# Patient Record
Sex: Female | Born: 1965 | Race: Black or African American | Hispanic: No | Marital: Single | Smoking: Former smoker
Health system: Southern US, Community
[De-identification: ages and names within clinical notes are randomized; demographics above are authoritative.]

## PROBLEM LIST (undated history)

## (undated) DIAGNOSIS — J302 Other seasonal allergic rhinitis: Secondary | ICD-10-CM

## (undated) DIAGNOSIS — K219 Gastro-esophageal reflux disease without esophagitis: Secondary | ICD-10-CM

## (undated) DIAGNOSIS — I10 Essential (primary) hypertension: Secondary | ICD-10-CM

## (undated) HISTORY — DX: Gastro-esophageal reflux disease without esophagitis: K21.9

## (undated) HISTORY — DX: Essential (primary) hypertension: I10

## (undated) HISTORY — PX: FOOT SURGERY: SHX648

## (undated) HISTORY — DX: Other seasonal allergic rhinitis: J30.2

## (undated) HISTORY — PX: BUNIONECTOMY: SHX129

---

## 2005-08-20 ENCOUNTER — Emergency Department: Payer: Self-pay | Admitting: Emergency Medicine

## 2005-10-02 ENCOUNTER — Emergency Department: Payer: Self-pay | Admitting: Emergency Medicine

## 2005-11-09 ENCOUNTER — Emergency Department: Payer: Self-pay | Admitting: Emergency Medicine

## 2005-11-10 ENCOUNTER — Ambulatory Visit: Payer: Self-pay | Admitting: Emergency Medicine

## 2006-04-06 ENCOUNTER — Emergency Department: Payer: Self-pay | Admitting: Internal Medicine

## 2006-05-17 ENCOUNTER — Observation Stay: Payer: Self-pay

## 2006-05-30 ENCOUNTER — Inpatient Hospital Stay: Payer: Self-pay

## 2006-10-28 ENCOUNTER — Emergency Department: Payer: Self-pay | Admitting: Emergency Medicine

## 2007-11-11 ENCOUNTER — Emergency Department: Payer: Self-pay | Admitting: Emergency Medicine

## 2007-12-25 ENCOUNTER — Ambulatory Visit: Payer: Self-pay | Admitting: Obstetrics and Gynecology

## 2008-01-02 ENCOUNTER — Ambulatory Visit: Payer: Self-pay | Admitting: Obstetrics and Gynecology

## 2011-11-25 ENCOUNTER — Emergency Department: Payer: Self-pay | Admitting: Emergency Medicine

## 2012-05-17 ENCOUNTER — Emergency Department: Payer: Self-pay | Admitting: Emergency Medicine

## 2015-05-15 ENCOUNTER — Emergency Department
Admission: EM | Admit: 2015-05-15 | Discharge: 2015-05-15 | Disposition: A | Discharge status: Home / Self Care | Payer: BLUE CROSS/BLUE SHIELD | Attending: Emergency Medicine | Admitting: Emergency Medicine

## 2015-05-15 ENCOUNTER — Encounter: Payer: Self-pay | Admitting: *Deleted

## 2015-05-15 DIAGNOSIS — X58XXXA Exposure to other specified factors, initial encounter: Secondary | ICD-10-CM | POA: Diagnosis not present

## 2015-05-15 DIAGNOSIS — T1591XA Foreign body on external eye, part unspecified, right eye, initial encounter: Secondary | ICD-10-CM | POA: Diagnosis present

## 2015-05-15 DIAGNOSIS — Y9289 Other specified places as the place of occurrence of the external cause: Secondary | ICD-10-CM | POA: Insufficient documentation

## 2015-05-15 DIAGNOSIS — S0501XA Injury of conjunctiva and corneal abrasion without foreign body, right eye, initial encounter: Secondary | ICD-10-CM | POA: Insufficient documentation

## 2015-05-15 DIAGNOSIS — Y990 Civilian activity done for income or pay: Secondary | ICD-10-CM | POA: Insufficient documentation

## 2015-05-15 DIAGNOSIS — Y9389 Activity, other specified: Secondary | ICD-10-CM | POA: Diagnosis not present

## 2015-05-15 MED ORDER — CIPROFLOXACIN HCL 0.3 % OP SOLN: 1.0000 [drp] | OPHTHALMIC | Status: AC

## 2015-05-15 MED ORDER — KETOROLAC TROMETHAMINE 0.5 % OP SOLN: 1.0000 [drp] | Freq: Four times a day (QID) | OPHTHALMIC | Status: DC

## 2015-05-15 MED ORDER — FLUORESCEIN SODIUM 1 MG OP STRP
ORAL_STRIP | OPHTHALMIC | Status: AC
  Filled 2015-05-15: qty 1

## 2015-05-15 MED ORDER — CIPROFLOXACIN HCL 0.3 % OP SOLN
2.0000 [drp] | OPHTHALMIC | Status: DC
  Administered 2015-05-15: 2 [drp] via OPHTHALMIC
  Filled 2015-05-15: qty 2.5

## 2015-05-15 NOTE — ED Notes (Signed)
Pt reports feeling like there is something in right eye

## 2015-05-15 NOTE — Discharge Instructions (Signed)
Corneal Abrasion °The cornea is the clear covering at the front and center of the eye. When looking at the colored portion of the eye (iris), you are looking through the cornea. This very thin tissue is made up of many layers. The surface layer is a single layer of cells (corneal epithelium) and is one of the most sensitive tissues in the body. If a scratch or injury causes the corneal epithelium to come off, it is called a corneal abrasion. If the injury extends to the tissues below the epithelium, the condition is called a corneal ulcer. °CAUSES  °· Scratches. °· Trauma. °· Foreign body in the eye. °Some people have recurrences of abrasions in the area of the original injury even after it has healed (recurrent erosion syndrome). Recurrent erosion syndrome generally improves and goes away with time. °SYMPTOMS  °· Eye pain. °· Difficulty or inability to keep the injured eye open. °· The eye becomes very sensitive to light. °· Recurrent erosions tend to happen suddenly, first thing in the morning, usually after waking up and opening the eye. °DIAGNOSIS  °Your health care provider can diagnose a corneal abrasion during an eye exam. Dye is usually placed in the eye using a drop or a small paper strip moistened by your tears. When the eye is examined with a special light, the abrasion shows up clearly because of the dye. °TREATMENT  °· Small abrasions may be treated with antibiotic drops or ointment alone. °· A pressure patch may be put over the eye. If this is done, follow your doctor's instructions for when to remove the patch. Do not drive or use machines while the eye patch is on. Judging distances is hard to do with a patch on. °If the abrasion becomes infected and spreads to the deeper tissues of the cornea, a corneal ulcer can result. This is serious because it can cause corneal scarring. Corneal scars interfere with light passing through the cornea and cause a loss of vision in the involved eye. °HOME CARE  INSTRUCTIONS °· Use medicine or ointment as directed. Only take over-the-counter or prescription medicines for pain, discomfort, or fever as directed by your health care provider. °· Do not drive or operate machinery if your eye is patched. Your ability to judge distances is impaired. °· If your health care provider has given you a follow-up appointment, it is very important to keep that appointment. Not keeping the appointment could result in a severe eye infection or permanent loss of vision. If there is any problem keeping the appointment, let your health care provider know. °SEEK MEDICAL CARE IF:  °· You have pain, light sensitivity, and a scratchy feeling in one eye or both eyes. °· Your pressure patch keeps loosening up, and you can blink your eye under the patch after treatment. °· Any kind of discharge develops from the eye after treatment or if the lids stick together in the morning. °· You have the same symptoms in the morning as you did with the original abrasion days, weeks, or months after the abrasion healed. °MAKE SURE YOU:  °· Understand these instructions. °· Will watch your condition. °· Will get help right away if you are not doing well or get worse. °Document Released: 08/31/2000 Document Revised: 09/08/2013 Document Reviewed: 05/11/2013 °ExitCare® Patient Information ©2015 [employer]. This information is not intended to replace advice given to you by your health care provider. Make sure you discuss any questions you have with your health care provider. ° °

## 2015-05-15 NOTE — ED Provider Notes (Signed)
[facility] Emergency Department Provider Note ____________________________________________  Time seen: Approximately 2:38 PM  I have reviewed the triage vital signs and the nursing notes.   HISTORY  Chief Complaint Foreign Body in Eye   HPI Sandra Brandt is a 49 y.o. female who presents to the emergency department for evaluation of right eye pain. She states that she felt something fall into her eye yesterday afternoon when she was outside putting chairs away at work. She states she thought she got whatever was out but has continued to have worsening right eye irritation. She states that when she can keep her eye open her vision is unchanged.    History reviewed. No pertinent past medical history.  There are no active problems to display for this patient.   History reviewed. No pertinent past surgical history.  Current Outpatient Rx  Name  Route  Sig  Dispense  Refill  . ciprofloxacin (CILOXAN) 0.3 % ophthalmic solution   Right Eye   Place 1 drop into the right eye every 4 (four) hours while awake. Administer 1 drop, every 2 hours, while awake, for 2 days. Then 1 drop, every 4 hours, while awake, for the next 5 days.   5 mL   0   . ketorolac (ACULAR) 0.5 % ophthalmic solution   Right Eye   Place 1 drop into the right eye 4 (four) times daily.   5 mL   0     Allergies Review of patient's allergies indicates no known allergies.  No family history on file.  Social History Social History  Substance Use Topics  . Smoking status: Never Smoker   . Smokeless tobacco: None  . Alcohol Use: No    Review of Systems   Constitutional: No fever/chills Eyes: Visual changes: no. ENT: No sore throat. Cardiovascular: Denies chest pain. Respiratory: Denies shortness of breath. Gastrointestinal: No abdominal pain.  No nausea, no vomiting.  No diarrhea.  No constipation. Musculoskeletal: Negative for pain. Skin: Negative for rash. Neurological:  Negative for headaches, focal weakness or numbness. Psychiatric:At baseline, no complaint Lymphatic:Swollen nodes-- no Allergic: Seasonal allergies: no 10-point ROS otherwise negative.  ____________________________________________  PHYSICAL EXAM:  VITAL SIGNS: ED Triage Vitals  Enc Vitals Group     BP 05/15/15 1356 156/100 mmHg     Pulse Rate 05/15/15 1356 57     Resp 05/15/15 1356 20     Temp 05/15/15 1356 98.9 F (37.2 C)     Temp Source 05/15/15 1356 Oral     SpO2 05/15/15 1356 100 %     Weight 05/15/15 1356 210 lb (95.255 kg)     Height 05/15/15 1356 5\' 7"  (1.702 m)     Head Cir --      Peak Flow --      Pain Score --      Pain Loc --      Pain Edu? --      Excl. in GC? --     Constitutional: Alert and oriented. Well appearing and in no acute distress. Eyes: Visual acuity--see nursing documentation; No globe trauma; Eyelids normal to inspection; Everted for exam yes; Conjunctiva and sclera: Mildly erythematous, no foreign body noted on magnified exam; Corneas: Linear abrasion to the 12:00 position as well as a second abrasion centrally; Examined with fluorescein yes; EOM's intact; Pupils PERRLA; Anterior Chambers normal with limited exam.  Head: Atraumatic. Nose: No congestion/rhinnorhea. Mouth/Throat: Mucous membranes are moist.  Oropharynx non-erythematous. Neck: No stridor.  Cardiovascular: Normal rate,  regular rhythm. Grossly normal heart sounds.  Good peripheral circulation. Respiratory: Normal respiratory effort.  No retractions. Gastrointestinal: Soft and nontender. No distention. No abdominal bruits. No CVA tenderness. Musculoskeletal:Normal ROM Neurologic:  Normal speech and language. No gross focal neurologic deficits are appreciated. Speech is normal. No gait instability. Skin:  Skin is warm, dry and intact. No rash noted. Psychiatric: Mood and affect are normal. Speech and behavior are normal.  ____________________________________________   LABS (all  labs ordered are listed, but only abnormal results are displayed)  Labs Reviewed - No data to display ____________________________________________  EKG   ____________________________________________  RADIOLOGY   ____________________________________________   PROCEDURES  Procedure(s) performed: None  ____________________________________________   INITIAL IMPRESSION / ASSESSMENT AND PLAN / ED COURSE  Pertinent labs & imaging results that were available during my care of the patient were reviewed by me and considered in my medical decision making (see chart for details).   Patient was advised to follow up with ophthalmology for symptoms that are not improving over the next 2 days. She was  also advised to return to the ER for symptoms that change or worsen if unable to schedule an appointment.  ____________________________________________   FINAL CLINICAL IMPRESSION(S) / ED DIAGNOSES  Final diagnoses:  Corneal abrasion, right, initial encounter       Chinita Pester, FNP 05/15/15 1442  Jennye Moccasin, MD 05/15/15 1544

## 2015-06-10 ENCOUNTER — Encounter: Payer: Self-pay | Admitting: Emergency Medicine

## 2015-06-10 ENCOUNTER — Emergency Department
Admission: EM | Admit: 2015-06-10 | Discharge: 2015-06-10 | Disposition: A | Discharge status: Home / Self Care | Payer: BLUE CROSS/BLUE SHIELD | Attending: Emergency Medicine | Admitting: Emergency Medicine

## 2015-06-10 DIAGNOSIS — M779 Enthesopathy, unspecified: Secondary | ICD-10-CM

## 2015-06-10 DIAGNOSIS — J302 Other seasonal allergic rhinitis: Secondary | ICD-10-CM | POA: Diagnosis not present

## 2015-06-10 DIAGNOSIS — M778 Other enthesopathies, not elsewhere classified: Secondary | ICD-10-CM

## 2015-06-10 DIAGNOSIS — Z791 Long term (current) use of non-steroidal anti-inflammatories (NSAID): Secondary | ICD-10-CM | POA: Diagnosis not present

## 2015-06-10 DIAGNOSIS — M65241 Calcific tendinitis, right hand: Secondary | ICD-10-CM | POA: Insufficient documentation

## 2015-06-10 DIAGNOSIS — M79641 Pain in right hand: Secondary | ICD-10-CM | POA: Diagnosis present

## 2015-06-10 MED ORDER — MELOXICAM 15 MG PO TABS: 15.0000 mg | ORAL_TABLET | Freq: Every day | ORAL | Status: DC

## 2015-06-10 NOTE — ED Notes (Signed)
States she developed pain to right hand couple of days ago. With min swelling to right knuckle area ..good pulses and circulation

## 2015-06-10 NOTE — ED Notes (Signed)
Reports right hand pain.  States she sweeps a lot at work

## 2015-06-10 NOTE — ED Provider Notes (Signed)
[facility] Emergency Department [place] Note ____________________________________________  Time seen: Approximately 10:18 AM  I have reviewed the triage vital signs and the nursing notes.   HISTORY  Chief Complaint Hand Pain    HPI Sandra Brandt is a 49 y.o. female presenting with right hand pain for the last 3-4 days. 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 She started noticing her right hand where it holds the broom has become acutely painful, stiff, and swollen. Pain mostly located to right index finger, thumb, and radiates to wrist. Pain is 10/10 stiff, throbbing pain with associated numbness and tingling. Pain is constant, but worsens with movement. Patient has also noticed swelling and finds it more difficult to extend her right index finger and have a strong grip. She has had no injury besides repetitive motion to that hand. She has tried no medications to relieve the pain and states that the pain and swelling is worsening. Patient denies erythema or bruising to the hand. Patient denies fever, chills, nausea, vomiting, diarrhea, and constipation.   History reviewed. No pertinent past medical history.  There are no active problems to display for this patient.   History reviewed. No pertinent past surgical history.  Current Outpatient Rx  Name  Route  Sig  Dispense  Refill  . ketorolac (ACULAR) 0.5 % ophthalmic solution   Right Eye   Place 1 drop into the right eye 4 (four) times daily.   5 mL   0   . meloxicam (MOBIC) 15 MG tablet   Oral   Take 1 tablet (15 mg total) by mouth daily.   30 tablet   2     Allergies Review of patient's allergies indicates no known allergies.  History reviewed. No pertinent family history.  Social History Social History  Substance Use Topics  . Smoking status: Never Smoker   . Smokeless tobacco: None  . Alcohol Use: No    Review of Systems Constitutional: No fever/chills Eyes: No visual  changes. ENT: No sore throat. Cardiovascular: Denies chest pain. Respiratory: Denies shortness of breath. Gastrointestinal: No abdominal pain.  No nausea, no vomiting.  No diarrhea.  No constipation. Genitourinary: Negative for dysuria. Musculoskeletal: Negative for back pain. Positive for right hand pain.  Skin: Negative for rash. Positive for swelling in right hand.  Neurological: Negative for headaches. Positive for focal weakness and numbness in right hand. Hematological/Lymphatic:Negative for easy bruising/bleeding Allergic/Immunilogical: Positive for seasonal allergies 10-point ROS otherwise negative.  ____________________________________________   PHYSICAL EXAM:  VITAL SIGNS: ED Triage Vitals  Enc Vitals Group     BP 06/10/15 0906 113/74 mmHg     Pulse Rate 06/10/15 0906 79     Resp 06/10/15 0906 20     Temp 06/10/15 0906 98.2 F (36.8 C)     Temp Source 06/10/15 0906 Oral     SpO2 06/10/15 0906 100 %     Weight 06/10/15 0906 210 lb (95.255 kg)     Height 06/10/15 0906  (1.702 m)     Head Cir --      Peak Flow --      Pain Score 06/10/15 0907 9     Pain Loc --      Pain Edu? --      Excl. in GC? --     Constitutional: Alert and oriented. Well appearing and in no acute distress. Eyes: Conjunctivae are normal. PERRL. EOMI. Head: Atraumatic. Nose: No congestion/rhinnorhea. Mouth/Throat: Mucous membranes are moist.  Oropharynx non-erythematous. Neck: No stridor.   Hematological/Lymphatic/Immunilogical: No cervical lymphadenopathy. Cardiovascular: Normal rate, regular rhythm. Grossly normal heart sounds.  Good peripheral circulation. Respiratory: Normal respiratory effort.  No retractions. Lungs CTAB. Gastrointestinal: Soft and nontender. No distention. No abdominal bruits. No CVA tenderness. Musculoskeletal: Right hand is swollen from index finger to thumb and down to wrist. No erythema or bruising noted. AROM of right index finger reduced and pain noted on  PROM of right index finger. Thumb abduction and adduction intact but painful. Strength 4/5 on right wrist. 5/5 strength on left upper extremity. Decreased grip strength on the right. No lower extremity tenderness nor edema.   Neurologic:  Normal speech and language. No gross focal neurologic deficits are appreciated. Sensation intact bilaterally. No gait instability. Skin:  Skin is warm, dry and intact. No rash noted. Swelling noted to right hand from index finger to thumb down to wrist.  Psychiatric: Mood and affect are normal. Speech and behavior are normal.  ____________________________________________   LABS (all labs ordered are listed, but only abnormal results are displayed)  Labs Reviewed - No data to display ____________________________________________  EKG  None ____________________________________________  RADIOLOGY  None indicated ____________________________________________   PROCEDURES  Procedure(s) performed:  SPLINT APPLICATION Date/Time: 1:45 PM Authorized by: Kem Boroughs Consent: Verbal consent obtained. Risks and benefits: risks, benefits and alternatives were discussed Consent given by: patient Splint applied by: RN  Location details: right wrist Splint type: cock up Supplies used: Velcro splint Post-procedure: The splinted body part was neurovascularly unchanged following the procedure. Patient tolerance: Patient tolerated the procedure well with no immediate complications.  Also, the index and 3rd digit were buddy taped.    Critical Care performed: No  ____________________________________________   INITIAL IMPRESSION / ASSESSMENT AND PLAN / ED COURSE  Pertinent labs & imaging results that were available during my care of the patient were reviewed by me and considered in my medical decision making (see chart for details).  Patient was advised follow-up with orthopedics if symptoms are not improving over the next week. She was advised to  take the medications as prescribed. She was advised to return to emergency department for symptoms that change or worsen if she is unable schedule an appointment with orthopedics ____________________________________________   FINAL CLINICAL IMPRESSION(S) / ED DIAGNOSES  Final diagnoses:  Tendinitis of right hand     Chinita Pester, FNP 06/10/15 1345  Darien Ramus, MD 06/10/15 1544

## 2015-06-10 NOTE — Discharge Instructions (Signed)

## 2015-07-14 ENCOUNTER — Emergency Department
Admission: EM | Admit: 2015-07-14 | Discharge: 2015-07-14 | Disposition: A | Discharge status: Home / Self Care | Payer: BLUE CROSS/BLUE SHIELD | Attending: Emergency Medicine | Admitting: Emergency Medicine

## 2015-07-14 ENCOUNTER — Encounter: Payer: Self-pay | Admitting: Emergency Medicine

## 2015-07-14 DIAGNOSIS — R030 Elevated blood-pressure reading, without diagnosis of hypertension: Secondary | ICD-10-CM | POA: Diagnosis not present

## 2015-07-14 DIAGNOSIS — Z791 Long term (current) use of non-steroidal anti-inflammatories (NSAID): Secondary | ICD-10-CM | POA: Insufficient documentation

## 2015-07-14 DIAGNOSIS — IMO0001 Reserved for inherently not codable concepts without codable children: Secondary | ICD-10-CM

## 2015-07-14 DIAGNOSIS — K0889 Other specified disorders of teeth and supporting structures: Secondary | ICD-10-CM | POA: Diagnosis present

## 2015-07-14 DIAGNOSIS — K029 Dental caries, unspecified: Secondary | ICD-10-CM | POA: Insufficient documentation

## 2015-07-14 MED ORDER — TRAMADOL HCL 50 MG PO TABS: 50.0000 mg | ORAL_TABLET | Freq: Four times a day (QID) | ORAL | Status: DC | PRN

## 2015-07-14 MED ORDER — AMOXICILLIN 500 MG PO CAPS: 500.0000 mg | ORAL_CAPSULE | Freq: Three times a day (TID) | ORAL | Status: DC

## 2015-07-14 NOTE — Discharge Instructions (Signed)
Dental Caries Dental caries is tooth decay. This decay can cause a hole in teeth (cavity) that can get bigger and deeper over time. HOME CARE  Brush and floss your teeth. Do this at least two times a day.  Use a fluoride toothpaste.  Use a mouth rinse if told by your dentist or doctor.  Eat less sugary and starchy foods. Drink less sugary drinks.  Avoid snacking often on sugary and starchy foods. Avoid sipping often on sugary drinks.  Keep regular checkups and cleanings with your dentist.  Use fluoride supplements if told by your dentist or doctor.  Allow fluoride to be applied to teeth if told by your dentist or doctor.   This information is not intended to replace advice given to you by your health care provider. Make sure you discuss any questions you have with your health care provider.   Document Released: 06/12/2008 Document Revised: 09/24/2014 Document Reviewed: 09/05/2012 Elsevier Interactive Patient Education [employer]2016 [employer]    You have still need to follow-up with a dentist of your choice for her dental pain. Take all of amoxicillin until completely finished. Tramadol as needed for severe pain. You will also need to see a orthopedist for your continued wrist pain. Dr. Martha ClanKrasinski is the orthopedist on call today. You need to call and make an appointment.

## 2015-07-14 NOTE — ED Notes (Signed)
States tooth pain for the past few days  With some facial swelling

## 2015-07-14 NOTE — ED Provider Notes (Signed)
[facility] Emergency Department Provider Note  ____________________________________________  Time seen: Approximately 8:21 AM  I have reviewed the triage vital signs and the nursing notes.   HISTORY  Chief Complaint Dental Pain    HPI Sandra Brandt is a 49 y.o. female is here with complaint of toothache for the last 2-3 days with some facial swelling this morning. Patient states that she is also here for tendinitis of her wrist and has forgotten the name of the orthopedist that she was supposed to follow up with. She has not taken any over-the-counter medication for her tooth other then Tylenol last evening. Currently she is not menopausal with a dentist.Patient states this morning her dental pain is worse and also causing pain into her right ear. She denies any known history of fever and denies chills. Patient rates her pain as a 10 out of 10. Pain is worse with smoking area and she denies any history of hypertension. There is a family history of hypertension.   History reviewed. No pertinent past medical history.  There are no active problems to display for this patient.   History reviewed. No pertinent past surgical history.  Current Outpatient Rx  Name  Route  Sig  Dispense  Refill  . amoxicillin (AMOXIL) 500 MG capsule   Oral   Take 1 capsule (500 mg total) by mouth 3 (three) times daily.   30 capsule   0   . ketorolac (ACULAR) 0.5 % ophthalmic solution   Right Eye   Place 1 drop into the right eye 4 (four) times daily.   5 mL   0   . meloxicam (MOBIC) 15 MG tablet   Oral   Take 1 tablet (15 mg total) by mouth daily.   30 tablet   2   . traMADol (ULTRAM) 50 MG tablet   Oral   Take 1 tablet (50 mg total) by mouth every 6 (six) hours as needed.   12 tablet   0     Allergies Review of patient's allergies indicates no known allergies.  No family history on file.  Social History Social History  Substance Use Topics  . Smoking  status: Never Smoker   . Smokeless tobacco: None  . Alcohol Use: No    Review of Systems Constitutional: No fever/chills ENT: Positive right ear pain Cardiovascular: Denies chest pain. Respiratory: Denies shortness of breath. Gastrointestinal:   No nausea, no vomiting.   Musculoskeletal: Negative for back pain. Skin: Negative for rash. Neurological: Negative for headaches, focal weakness or numbness.  10-point ROS otherwise negative.  ____________________________________________   PHYSICAL EXAM:  VITAL SIGNS: ED Triage Vitals  Enc Vitals Group     BP --      Pulse --      Resp --      Temp --      Temp src --      SpO2 --      Weight --      Height --      Head Cir --      Peak Flow --      Pain Score --      Pain Loc --      Pain Edu? --      Excl. in GC? --     Constitutional: Alert and oriented. Well appearing and in no acute distress. Eyes: Conjunctivae are normal. PERRL. EOMI. Head: Atraumatic. Nose: No congestion/rhinnorhea.     Right EAC and TM is clear Mouth/Throat:  Mucous membranes are moist.  Oropharynx non-erythematous. Right lower molar has dental carry between the bridge of her tooth teeth. There is some minimal gum edema but no actual abscess seen. Area is moderately tender. Neck: No stridor.  Supple Hematological/Lymphatic/Immunilogical: No cervical lymphadenopathy. Cardiovascular: Normal rate, regular rhythm. Grossly normal heart sounds.  Good peripheral circulation. Respiratory: Normal respiratory effort.  No retractions. Lungs CTAB. Gastrointestinal: Soft and nontender. No distention. Musculoskeletal: No lower extremity tenderness nor edema.  No joint effusions. Neurologic:  Normal speech and language. No gross focal neurologic deficits are appreciated. No gait instability. Skin:  Skin is warm, dry and intact. No rash noted. Psychiatric: Mood and affect are normal. Speech and behavior are normal.  ____________________________________________    LABS (all labs ordered are listed, but only abnormal results are displayed)  Labs Reviewed - No data to display  PROCEDURES  Procedure(s) performed: None  Critical Care performed: No  ____________________________________________   INITIAL IMPRESSION / ASSESSMENT AND PLAN / ED COURSE  Pertinent labs & imaging results that were available during my care of the patient were reviewed by me and considered in my medical decision making (see chart for details).  Patient was started on amoxicillin 500 mg 3 times a day for 10 days along with tramadol every 4 hours when necessary severe pain #12 no refill. Patient was also given the name of the orthopedist that was on call today for follow-up of her continued right wrist pain. Blood pressure was rechecked and patient was told to follow-up with her PCP for reevaluation of her elevated blood pressure. Patient works the night shift and states she is in a great deal of pain. She has not been told in the past that she had hypertension. Repeat blood pressure in the emergency room was decreased in comparison to the triage vital signs. ____________________________________________   FINAL CLINICAL IMPRESSION(S) / ED DIAGNOSES  Final diagnoses:  Pain due to dental caries  Elevated blood pressure      Tommi RumpsRhonda L Sailor Hevia, PA-C 07/14/15 [id number]  Arnaldo NatalPaul F Malinda, MD 07/14/15 1459

## 2017-09-06 ENCOUNTER — Encounter: Payer: Self-pay | Admitting: Emergency Medicine

## 2017-09-06 ENCOUNTER — Emergency Department: Payer: BLUE CROSS/BLUE SHIELD

## 2017-09-06 ENCOUNTER — Emergency Department
Admission: EM | Admit: 2017-09-06 | Discharge: 2017-09-06 | Disposition: A | Discharge status: Home / Self Care | Payer: BLUE CROSS/BLUE SHIELD | Attending: Emergency Medicine | Admitting: Emergency Medicine

## 2017-09-06 DIAGNOSIS — Y929 Unspecified place or not applicable: Secondary | ICD-10-CM | POA: Diagnosis not present

## 2017-09-06 DIAGNOSIS — X58XXXA Exposure to other specified factors, initial encounter: Secondary | ICD-10-CM | POA: Diagnosis not present

## 2017-09-06 DIAGNOSIS — S8992XA Unspecified injury of left lower leg, initial encounter: Secondary | ICD-10-CM | POA: Diagnosis present

## 2017-09-06 DIAGNOSIS — Z79899 Other long term (current) drug therapy: Secondary | ICD-10-CM | POA: Diagnosis not present

## 2017-09-06 DIAGNOSIS — Y939 Activity, unspecified: Secondary | ICD-10-CM | POA: Insufficient documentation

## 2017-09-06 DIAGNOSIS — Y999 Unspecified external cause status: Secondary | ICD-10-CM | POA: Diagnosis not present

## 2017-09-06 DIAGNOSIS — S8012XA Contusion of left lower leg, initial encounter: Secondary | ICD-10-CM | POA: Insufficient documentation

## 2017-09-06 MED ORDER — NAPROXEN 500 MG PO TABS: 500.0000 mg | ORAL_TABLET | Freq: Two times a day (BID) | ORAL | Status: DC

## 2017-09-06 MED ORDER — TRAMADOL HCL 50 MG PO TABS: 50.0000 mg | ORAL_TABLET | Freq: Four times a day (QID) | ORAL | 0 refills | Status: DC | PRN

## 2017-09-06 NOTE — ED Triage Notes (Addendum)
Patient struck her left lower medial leg, superior to the ankle, approx 2 weeks ago. Patient states that she has a "big bump" in the same location that has decreased in size since the injury. Requesting evaluation for same. Site is mildly swollen and painful to the touch. Ambulatory without difficulty

## 2017-09-06 NOTE — ED Notes (Signed)
See triage note  States she hit her left lower leg about 2 weeks ago. She conts to have pain with some bruising and swelling ..Marland Kitchen

## 2017-09-06 NOTE — ED Provider Notes (Signed)
[facility] Emergency Department Provider Note   ____________________________________________   First MD Initiated Contact with Patient 09/06/17 [phone]     (approximate)  I have reviewed the triage vital signs and the nursing notes.   HISTORY  Chief Complaint Leg Pain    HPI Sandra Brandt is a 51 y.o. female patient concerned for swelling to the left lower leg secondary to a contusion weeks ago. Patient state patient stated swelling is marked decreased since the incident.t painful to touch. Patient able to ambulate without difficulty. Patient rates pain 6/10 with palpation only. No palliative measures for complaint.  History reviewed. No pertinent past medical history.  There are no active problems to display for this patient.   History reviewed. No pertinent surgical history.  Prior to Admission medications   Medication Sig Start Date End Date Taking? Authorizing Provider  amoxicillin (AMOXIL) 500 MG capsule Take 1 capsule (500 mg total) by mouth 3 (three) times daily. 07/14/15   Tommi RumpsSummers, Rhonda L, PA-C  ketorolac (ACULAR) 0.5 % ophthalmic solution Place 1 drop into the right eye 4 (four) times daily. 05/15/15   Triplett, Rulon Eisenmengerari B, FNP  meloxicam (MOBIC) 15 MG tablet Take 1 tablet (15 mg total) by mouth daily. 06/10/15   Triplett, Rulon Eisenmengerari B, FNP  naproxen (NAPROSYN) 500 MG tablet Take 1 tablet (500 mg total) by mouth 2 (two) times daily with a meal. 09/06/17   Joni ReiningSmith, Charlsie Fleeger K, PA-C  traMADol (ULTRAM) 50 MG tablet Take 1 tablet (50 mg total) by mouth every 6 (six) hours as needed. 07/14/15   Tommi RumpsSummers, Rhonda L, PA-C  traMADol (ULTRAM) 50 MG tablet Take 1 tablet (50 mg total) by mouth every 6 (six) hours as needed for moderate pain. 09/06/17   Joni ReiningSmith, Fumi Guadron K, PA-C    Allergies Patient has no known allergies.  History reviewed. No pertinent family history.  Social History Social History   Tobacco Use  . Smoking status: Never Smoker  . Smokeless tobacco:  Never Used  Substance Use Topics  . Alcohol use: No  . Drug use: Not on file    Review of Systems Constitutional: No fever/chills Eyes: No visual changes. ENT: No sore throat. Cardiovascular: Denies chest pain. Respiratory: Denies shortness of breath. Gastrointestinal: No abdominal pain.  No nausea, no vomiting.  No diarrhea.  No constipation. Genitourinary: Negative for dysuria. Musculoskeletal: Lower leg pain. Skin: Negative for rash. Neurological: Negative for headaches, focal weakness or numbness.   ____________________________________________   PHYSICAL EXAM:  VITAL SIGNS: ED Triage Vitals  Enc Vitals Group     BP 09/06/17 0800 (!) 176/91     Pulse Rate 09/06/17 0800 86     Resp 09/06/17 0800 18     Temp 09/06/17 0800 98.4 F (36.9 C)     Temp Source 09/06/17 0800 Oral     SpO2 09/06/17 0800 100 %     Weight 09/06/17 0757 215 lb (97.5 kg)     Height 09/06/17 0757 5\' 6"  (1.676 m)     Head Circumference --      Peak Flow --      Pain Score 09/06/17 0755 6     Pain Loc --      Pain Edu? --      Excl. in GC? --     Constitutional: Alert and oriented. Well appearing and in no acute distress. Cardiovascular: Normal rate, regular rhythm. Grossly normal heart sounds.  Good peripheral circulation. Respiratory: Normal respiratory effort.  No retractions. Lungs  CTAB. Gastrointestinal: Soft and nontender. No distention. No abdominal bruits. No CVA tenderness. Musculoskeletal: No obvious deformity to the left lower leg. Patient has mild guarding palpation distal medial tibia area.  Neurologic:  Normal speech and language. No gross focal neurologic deficits are appreciated. No gait instability. Skin:  Skin is warm, dry and intact. No rash noted. Small hematoma Psychiatric: Mood and affect are normal. Speech and behavior are normal.  ____________________________________________   LABS (all labs ordered are listed, but only abnormal results are displayed)  Labs Reviewed  - No data to display ____________________________________________  EKG   ____________________________________________  RADIOLOGY  Dg Ankle 2 Views Left  Result Date: 09/06/2017 CLINICAL DATA:  Struck medial ankle 2 weeks ago with persistent pain. EXAM: LEFT ANKLE - 2 VIEW COMPARISON:  None. FINDINGS: There is no evidence of fracture, dislocation, or joint effusion. There is no evidence of arthropathy or other focal bone abnormality. Soft tissues are unremarkable. IMPRESSION: Negative. Electronically Signed   By: Paulina FusiMark  Shogry M.D.   On: 09/06/2017 08:47    ____________________________________________   PROCEDURES  Procedure(s) performed: None  Procedures  Critical Care performed: No  ____________________________________________   INITIAL IMPRESSION / ASSESSMENT AND PLAN / ED COURSE  As part of my medical decision making, I reviewed the following data within the electronic MEDICAL RECORD NUMBER    Lower leg pain secondary to contusion with resolving hematoma. Discussed x-ray findings with palpation. Patient given discharge care instructions and advised over-the-counter anti-inflammatory medication appropriate this time.      ____________________________________________   FINAL CLINICAL IMPRESSION(S) / ED DIAGNOSES  Final diagnoses:  Leg hematoma, left, initial encounter     ED Discharge Orders        Ordered    naproxen (NAPROSYN) 500 MG tablet  2 times daily with meals     09/06/17 0900    traMADol (ULTRAM) 50 MG tablet  Every 6 hours PRN     09/06/17 0900       Note:  This document was prepared using Dragon voice recognition software and may include unintentional dictation errors.    Joni ReiningSmith, Doniesha Landau K, PA-C 09/06/17 [id number]    Arnaldo NatalMalinda, Paul F, MD 09/06/17 [phone]

## 2019-01-14 ENCOUNTER — Emergency Department
Admission: EM | Admit: 2019-01-14 | Discharge: 2019-01-14 | Disposition: A | Discharge status: Home / Self Care | Payer: BLUE CROSS/BLUE SHIELD | Attending: Emergency Medicine | Admitting: Emergency Medicine

## 2019-01-14 ENCOUNTER — Other Ambulatory Visit: Payer: Self-pay

## 2019-01-14 ENCOUNTER — Emergency Department: Payer: BLUE CROSS/BLUE SHIELD

## 2019-01-14 DIAGNOSIS — R0789 Other chest pain: Secondary | ICD-10-CM | POA: Diagnosis not present

## 2019-01-14 DIAGNOSIS — K219 Gastro-esophageal reflux disease without esophagitis: Secondary | ICD-10-CM | POA: Diagnosis not present

## 2019-01-14 DIAGNOSIS — R079 Chest pain, unspecified: Secondary | ICD-10-CM | POA: Insufficient documentation

## 2019-01-14 DIAGNOSIS — Z20828 Contact with and (suspected) exposure to other viral communicable diseases: Secondary | ICD-10-CM | POA: Diagnosis not present

## 2019-01-14 LAB — CBC
HCT: 40 % (ref 36.0–46.0)
Hemoglobin: 13 g/dL (ref 12.0–15.0)
MCH: 31 pg (ref 26.0–34.0)
MCHC: 32.5 g/dL (ref 30.0–36.0)
MCV: 95.5 fL (ref 80.0–100.0)
Platelets: 265 10*3/uL (ref 150–400)
RBC: 4.19 MIL/uL (ref 3.87–5.11)
RDW: 13.8 % (ref 11.5–15.5)
WBC: 6.2 10*3/uL (ref 4.0–10.5)
nRBC: 0 % (ref 0.0–0.2)

## 2019-01-14 LAB — BASIC METABOLIC PANEL
Anion gap: 8 (ref 5–15)
BUN: 13 mg/dL (ref 6–20)
CO2: 27 mmol/L (ref 22–32)
Calcium: 9.2 mg/dL (ref 8.9–10.3)
Chloride: 106 mmol/L (ref 98–111)
Creatinine, Ser: 0.84 mg/dL (ref 0.44–1.00)
GFR calc Af Amer: >60 mL/min (ref >60)
GFR calc non Af Amer: >60 mL/min (ref >60)
Glucose, Bld: 88 mg/dL (ref 70–99)
Potassium: 3.9 mmol/L (ref 3.5–5.1)
Sodium: 141 mmol/L (ref 135–145)

## 2019-01-14 LAB — TROPONIN I
Troponin I: <0.03 ng/mL (ref <0.03)
Troponin I: <0.03 ng/mL (ref <0.03)

## 2019-01-14 MED ORDER — FLUTICASONE PROPIONATE 50 MCG/ACT NA SUSP: 2.0000 | Freq: Every day | NASAL | 1 refills | Status: DC

## 2019-01-14 MED ORDER — FAMOTIDINE 20 MG PO TABS
40.0000 mg | ORAL_TABLET | Freq: Once | ORAL | Status: AC
Start: 2019-01-14 — End: 2019-01-14
  Administered 2019-01-14: 15:00:00 40 mg via ORAL
  Filled 2019-01-14: qty 2

## 2019-01-14 MED ORDER — FAMOTIDINE 40 MG PO TABS: 40.0000 mg | ORAL_TABLET | Freq: Every evening | ORAL | 1 refills | Status: DC

## 2019-01-14 NOTE — ED Notes (Signed)
Pt verbalized understanding of discharge instructions. NAD at this time. 

## 2019-01-14 NOTE — ED Notes (Signed)
Signature pad not working. Hard copy printed and signed by patient.  

## 2019-01-14 NOTE — ED Notes (Signed)
PA Jenise at bedside. Pt states chest pressure as if she needs to "belch". Pt states she feels she would feel better if she could. Pt has c/o of scratchy throat for approx 1 week. Pt denies fever and N/V/D.

## 2019-01-14 NOTE — ED Triage Notes (Signed)
Chest pressure intermittently for last week that began with scratchy throat for last week. Throat gets better after taking allergy pills. Pt alert and oriented X4, active, cooperative, pt in NAD. RR even and unlabored, color WNL.

## 2019-01-14 NOTE — Discharge Instructions (Signed)
Your exam, labs, EKG, and chest x-ray are all normal and reassuring at this time. Your symptoms do not appear to be related to any problems with your heart. Your symptoms may be due to reflux. Take the prescription reflux meds as directed. Follow-up with your provider or return as needed.

## 2019-01-14 NOTE — ED Provider Notes (Signed)
[facility] Emergency Department Provider Note ____________________________________________  Time seen: [id number]  I have reviewed the triage vital signs and the nursing notes.  HISTORY  Chief Complaint  Chest Pain and Scratchy throat  HPI Erling CruzBelinda D Blaydes is a 53 y.o. female presents herself to the ED for evaluation of intermittent substernal chest pressure she describes that began last week.  She describes onset of symptoms began after she started experiencing scratchy throat.  She reports a history of seasonal allergies, and has had some postnasal drainage.  She denies any shortness of breath, cough, congestion, or fevers.  She reports improvement of a scratchy sore throat after taking her allergy medicines.  Since that time, however, she has had sense of pressure in the substernal chest, as if she has to belch or burp.  She is had normal appetite and normal oral intake.  She denies any high risk exposures, recent travel, or other concerns or COVID-19 at this time.  Patient is an essential work.  She still been employed at [employer] in [employer].  She denies any significant medical history, denies any history of hypertension, acute MI, or reflux.  History reviewed. No pertinent past medical history.  There are no active problems to display for this patient.  History reviewed. No pertinent surgical history.  Prior to Admission medications   Medication Sig Start Date End Date Taking? Authorizing Provider  famotidine (PEPCID) 40 MG tablet Take 1 tablet (40 mg total) by mouth every evening. 01/14/19 03/15/19  Kanyon Seibold, Charlesetta IvoryJenise V Bacon, PA-C  fluticasone (FLONASE) 50 MCG/ACT nasal spray Place 2 sprays into both nostrils daily. 01/14/19   Valincia Touch, Charlesetta IvoryJenise V Bacon, PA-C    Allergies Patient has no known allergies.  No family history on file.  Social History Social History   Tobacco Use  . Smoking status: Never Smoker  . Smokeless tobacco: Never Used   Substance Use Topics  . Alcohol use: No  . Drug use: Not on file    Review of Systems  Constitutional: Negative for fever. Eyes: Negative for visual changes. ENT: Positive for scratchy throat. Cardiovascular: Positive for substernal chest pain. Respiratory: Negative for shortness of breath. Gastrointestinal: Negative for abdominal pain, vomiting and diarrhea. Feels like belching, but can't  Genitourinary: Negative for dysuria. Musculoskeletal: Negative for back pain. Skin: Negative for rash. Neurological: Negative for headaches, focal weakness or numbness. ____________________________________________  PHYSICAL EXAM:  VITAL SIGNS: ED Triage Vitals [01/14/19 1319]  Enc Vitals Group     BP (!) 196/104     Pulse Rate 93     Resp 18     Temp 98.4 F (36.9 C)     Temp Source Oral     SpO2 99 %     Weight 250 lb (113.4 kg)     Height 5\' 6"  (1.676 m)     Head Circumference      Peak Flow      Pain Score 0     Pain Loc      Pain Edu?      Excl. in GC?     Constitutional: Alert and oriented. Well appearing and in no distress. Head: Normocephalic and atraumatic. Eyes: Conjunctivae are normal. Normal extraocular movements Ears: Canals clear. TMs intact bilaterally. Nose: No congestion/rhinorrhea/epistaxis. Mouth/Throat: Mucous membranes are moist.  Uvula is midline and tonsils are flat.  No oropharyngeal lesions appreciated. Neck: Supple. No thyromegaly. Cardiovascular: Normal rate, regular rhythm. Normal distal pulses. Respiratory: Normal respiratory effort. No wheezes/rales/rhonchi. Gastrointestinal: Soft and  nontender. No distention. Musculoskeletal: Nontender with normal range of motion in all extremities.  Neurologic:  Normal gait without ataxia. Normal speech and language. No gross focal neurologic deficits are appreciated. Skin:  Skin is warm, dry and intact. No rash noted. ____________________________________________   LABS (pertinent positives/negatives) Labs  Reviewed  BASIC METABOLIC PANEL  CBC  TROPONIN I  TROPONIN I  ____________________________________________  EKG NSR 85 bpm Normal axis/intervals No STEMI ____________________________________________   RADIOLOGY  CXR  IMPRESSION: No edema or consolidation. ____________________________________________  PROCEDURES  Procedures Famotidine 40 mg PO ____________________________________________  INITIAL IMPRESSION / ASSESSMENT AND PLAN / ED COURSE  Deyci Rabadi Oleski was evaluated in Emergency Department on 01/14/2019 for the symptoms described in the history of present illness. She was evaluated in the context of the global COVID-19 pandemic, which necessitated consideration that the patient might be at risk for infection with the SARS-CoV-2 virus that causes COVID-19. Institutional protocols and algorithms that pertain to the evaluation of patients at risk for COVID-19 are in a state of rapid change based on information released by regulatory bodies including the CDC and federal and state organizations. These policies and algorithms were followed during the patient's care in the ED.  Patient with ED evaluation of intermittent substernal chest pain and pressure.  Her exam is overall benign and reassuring at this time.  Troponin is negative, chest x-ray is benign and she reports improvement of her symptoms.  She also admits to having a spine belch following administration of famotidine.  Patient's chest pain does not appear to be cardiac in nature at this time.  She be discharged with prescriptions for famotidine as well as Flonase for seasonal allergies patient with primary provider return to the ED as needed. ____________________________________________  FINAL CLINICAL IMPRESSION(S) / ED DIAGNOSES  Final diagnoses:  Chest pain, unspecified type  Gastroesophageal reflux disease without esophagitis      Karmen Stabs, Charlesetta Ivory, PA-C 01/14/19 2107    Sharman Cheek, MD 01/17/19  [phone]

## 2019-08-15 ENCOUNTER — Encounter: Payer: Self-pay | Admitting: Intensive Care

## 2019-08-15 ENCOUNTER — Emergency Department
Admission: EM | Admit: 2019-08-15 | Discharge: 2019-08-15 | Disposition: A | Discharge status: Home / Self Care | Payer: BC Managed Care – PPO | Attending: Emergency Medicine | Admitting: Emergency Medicine

## 2019-08-15 ENCOUNTER — Other Ambulatory Visit: Payer: Self-pay

## 2019-08-15 DIAGNOSIS — Z79899 Other long term (current) drug therapy: Secondary | ICD-10-CM | POA: Insufficient documentation

## 2019-08-15 DIAGNOSIS — R300 Dysuria: Secondary | ICD-10-CM | POA: Insufficient documentation

## 2019-08-15 DIAGNOSIS — N3000 Acute cystitis without hematuria: Secondary | ICD-10-CM | POA: Diagnosis not present

## 2019-08-15 DIAGNOSIS — R3989 Other symptoms and signs involving the genitourinary system: Secondary | ICD-10-CM

## 2019-08-15 LAB — URINALYSIS, COMPLETE (UACMP) WITH MICROSCOPIC
Bilirubin Urine: NEGATIVE
Glucose, UA: NEGATIVE mg/dL
Ketones, ur: NEGATIVE mg/dL
Nitrite: NEGATIVE
Protein, ur: 30 mg/dL — AB
Specific Gravity, Urine: 1.023 (ref 1.005–1.030)
pH: 5 (ref 5.0–8.0)

## 2019-08-15 MED ORDER — NITROFURANTOIN MONOHYD MACRO 100 MG PO CAPS
100.0000 mg | ORAL_CAPSULE | Freq: Once | ORAL | Status: AC
  Administered 2019-08-15: 100 mg via ORAL
  Filled 2019-08-15: qty 1

## 2019-08-15 MED ORDER — NITROFURANTOIN MONOHYD MACRO 100 MG PO CAPS: 100.0000 mg | ORAL_CAPSULE | Freq: Two times a day (BID) | ORAL | 0 refills | Status: DC

## 2019-08-15 NOTE — Discharge Instructions (Addendum)
You were seen to day for a UTI. You were given your first dose of antibiotics in the ER. I have given you a RX for Macrobid 100 mg 2 x day for 5 days. Push fluids, continue AZO. Return if symptoms persist or worsen

## 2019-08-15 NOTE — ED Triage Notes (Signed)
C/o burning during urination for a couple of days

## 2019-08-15 NOTE — ED Notes (Signed)
See triage note. Presents with lower back pain   Also is having burning with urination

## 2019-08-15 NOTE — ED Provider Notes (Signed)
[facility] Emergency Department Provider Note ____________________________________________  Time seen: 1250  I have reviewed the triage vital signs and the nursing notes.  HISTORY  Chief Complaint  Back Pain   HPI Sandra Brandt is a 53 y.o. female presents to the ER with complaint of dysuria and bladder pressure.  She reports this started 5 days ago.  She denies urgency, frequency or blood in her urine.  She denies fever, chills, nausea or low back pain.  She denies vaginal discharge, itching, odor or abnormal bleeding.  She has tried AZO and ibuprofen OTC with some relief.  History reviewed. No pertinent past medical history.  There are no active problems to display for this patient.   History reviewed. No pertinent surgical history.  Prior to Admission medications   Medication Sig Start Date End Date Taking? Authorizing Provider  famotidine (PEPCID) 40 MG tablet Take 1 tablet (40 mg total) by mouth every evening. 01/14/19 03/15/19  Menshew, Charlesetta Ivory, PA-C  fluticasone (FLONASE) 50 MCG/ACT nasal spray Place 2 sprays into both nostrils daily. 01/14/19   Menshew, Charlesetta Ivory, PA-C  nitrofurantoin, macrocrystal-monohydrate, (MACROBID) 100 MG capsule Take 1 capsule (100 mg total) by mouth 2 (two) times daily. 08/15/19   Lorre Munroe, NP    Allergies Sulfur  History reviewed. No pertinent family history.  Social History Social History   Tobacco Use  . Smoking status: Never Smoker  . Smokeless tobacco: Never Used  Substance Use Topics  . Alcohol use: No  . Drug use: Never    Review of Systems  Constitutional: Negative for fever, chills or body aches. Cardiovascular: Negative for chest pain or chest tightness. Respiratory: Negative for cough or shortness of breath. Gastrointestinal: Positive for bladder pressure.  Negative for abdominal pain, or nausea. Genitourinary: Positive for dysuria.  Negative for urgency, frequency, blood in  urine, vaginal discharge, odor, irritation or abnormal bleeding. Musculoskeletal: Negative for low back pain.  PHYSICAL EXAM:  VITAL SIGNS: ED Triage Vitals  Enc Vitals Group     BP 08/15/19 1205 (!) 173/100     Pulse Rate 08/15/19 1205 88     Resp 08/15/19 1205 16     Temp 08/15/19 1205 99.3 F (37.4 C)     Temp Source 08/15/19 1205 Oral     SpO2 08/15/19 1205 99 %     Weight 08/15/19 1206 194 lb (88 kg)     Height 08/15/19 1206 5\' 7"  (1.702 m)     Head Circumference --      Peak Flow --      Pain Score 08/15/19 1205 6     Pain Loc --      Pain Edu? --      Excl. in GC? --     Constitutional: Alert and oriented.  Obese, in no distress. Cardiovascular: Normal rate, regular rhythm.  Respiratory: Normal respiratory effort. No wheezes/rales/rhonchi. Gastrointestinal: Soft, mildly tender over the bladder.  Negative CVA tenderness bilaterally Neurologic:  Normal gait without ataxia. Normal speech and language. No gross focal neurologic deficits are appreciated.  ____________________________________________   LABS Urinalysis    Component Value Date/Time   COLORURINE YELLOW (A) 08/15/2019 1208   APPEARANCEUR HAZY (A) 08/15/2019 1208   LABSPEC 1.023 08/15/2019 1208   PHURINE 5.0 08/15/2019 1208   GLUCOSEU NEGATIVE 08/15/2019 1208   HGBUR SMALL (A) 08/15/2019 1208   BILIRUBINUR NEGATIVE 08/15/2019 1208   KETONESUR NEGATIVE 08/15/2019 1208   PROTEINUR 30 (A) 08/15/2019 1208  NITRITE NEGATIVE 08/15/2019 1208   LEUKOCYTESUR LARGE (A) 08/15/2019 1208     ____________________________________________  INITIAL IMPRESSION / ASSESSMENT AND PLAN / ED COURSE  Dysuria, Bladder Pressure secondary to UTI:  Urinalysis as above Macrobid 100 mg PO x 1 in ER RX for Macrobid BID x 5 days Push fluids Continue AZO OTC as needed _________________________________________________ ____ FINAL CLINICAL IMPRESSION(S) / ED DIAGNOSES  Final diagnoses:  Dysuria  Sensation of pressure in  bladder area  Acute cystitis without hematuria  Webb Silversmith, NP     Jearld Fenton, NP 08/15/19 [place]    Vanessa Snelling, MD 08/16/19 [phone]

## 2019-09-20 ENCOUNTER — Encounter: Payer: Self-pay | Admitting: Emergency Medicine

## 2019-09-20 ENCOUNTER — Emergency Department
Admission: EM | Admit: 2019-09-20 | Discharge: 2019-09-20 | Disposition: A | Discharge status: Home / Self Care | Payer: BC Managed Care – PPO | Attending: Student in an Organized Health Care Education/Training Program | Admitting: Student in an Organized Health Care Education/Training Program

## 2019-09-20 ENCOUNTER — Other Ambulatory Visit: Payer: Self-pay

## 2019-09-20 DIAGNOSIS — R002 Palpitations: Secondary | ICD-10-CM | POA: Insufficient documentation

## 2019-09-20 DIAGNOSIS — Z79899 Other long term (current) drug therapy: Secondary | ICD-10-CM | POA: Insufficient documentation

## 2019-09-20 LAB — CBC WITH DIFFERENTIAL/PLATELET
Abs Immature Granulocytes: 0.02 10*3/uL (ref 0.00–0.07)
Basophils Absolute: 0 10*3/uL (ref 0.0–0.1)
Basophils Relative: 0 %
Eosinophils Absolute: 0.1 10*3/uL (ref 0.0–0.5)
Eosinophils Relative: 1 %
HCT: 36.3 % (ref 36.0–46.0)
Hemoglobin: 12.3 g/dL (ref 12.0–15.0)
Immature Granulocytes: 0 %
Lymphocytes Relative: 31 %
Lymphs Abs: 1.8 10*3/uL (ref 0.7–4.0)
MCH: 30.1 pg (ref 26.0–34.0)
MCHC: 33.9 g/dL (ref 30.0–36.0)
MCV: 89 fL (ref 80.0–100.0)
Monocytes Absolute: 0.5 10*3/uL (ref 0.1–1.0)
Monocytes Relative: 9 %
Neutro Abs: 3.4 10*3/uL (ref 1.7–7.7)
Neutrophils Relative %: 59 %
Platelets: 209 10*3/uL (ref 150–400)
RBC: 4.08 MIL/uL (ref 3.87–5.11)
RDW: 14.2 % (ref 11.5–15.5)
WBC: 5.8 10*3/uL (ref 4.0–10.5)
nRBC: 0 % (ref 0.0–0.2)

## 2019-09-20 LAB — COMPREHENSIVE METABOLIC PANEL
ALT: 21 U/L (ref 0–44)
AST: 22 U/L (ref 15–41)
Albumin: 3.8 g/dL (ref 3.5–5.0)
Alkaline Phosphatase: 90 U/L (ref 38–126)
Anion gap: 10 (ref 5–15)
BUN: 19 mg/dL (ref 6–20)
CO2: 21 mmol/L — ABNORMAL LOW (ref 22–32)
Calcium: 8.6 mg/dL — ABNORMAL LOW (ref 8.9–10.3)
Chloride: 112 mmol/L — ABNORMAL HIGH (ref 98–111)
Creatinine, Ser: 0.91 mg/dL (ref 0.44–1.00)
GFR calc Af Amer: >60 mL/min (ref >60)
GFR calc non Af Amer: >60 mL/min (ref >60)
Glucose, Bld: 89 mg/dL (ref 70–99)
Potassium: 3.7 mmol/L (ref 3.5–5.1)
Sodium: 143 mmol/L (ref 135–145)
Total Bilirubin: 0.5 mg/dL (ref 0.3–1.2)
Total Protein: 7.6 g/dL (ref 6.5–8.1)

## 2019-09-20 LAB — TSH: TSH: 1.862 u[IU]/mL (ref 0.350–4.500)

## 2019-09-20 LAB — TROPONIN I (HIGH SENSITIVITY)
Troponin I (High Sensitivity): 4 ng/L (ref <18)
Troponin I (High Sensitivity): 5 ng/L (ref <18)

## 2019-09-20 NOTE — ED Triage Notes (Signed)
Pt arrived via POV with reports feeling like she has mucus stuck in her throat. Pt states she has been having allergy sxs and takes medications for allergies.

## 2019-09-20 NOTE — ED Notes (Signed)
Pt states she has allergies and keeps feeling like she needs to cough phlegm out but can't. Voice is clear, no hoarseness or wheezing. In nad.

## 2019-09-20 NOTE — ED Triage Notes (Signed)
Pt c/o sore throat, ,like some thing is stuck since yesterday. Airway is patent.

## 2019-09-20 NOTE — Discharge Instructions (Addendum)
Cut back on her caffeine.  Try and decrease her stress.  I will call you about your thyroid test.  Follow-up at the [facility] wellness center.  Return if you are worsening.  If your palpitations continue after you have tried decreasing your caffeine, please call and make an appointment with Dr. Napa Sink.

## 2019-09-20 NOTE — ED Notes (Signed)
Recollect of lt green tube sent to lab.

## 2019-09-20 NOTE — ED Provider Notes (Signed)
[facility] Emergency Department Provider Note  ____________________________________________   First MD Initiated Contact with Patient 09/20/19 1117     (approximate)  I have reviewed the triage vital signs and the nursing notes.   HISTORY  Chief Complaint Sore Throat    HPI Sandra Brandt is a 54 y.o. female presents emergency department complaining of feeling like her heart is fluttering at the base of her throat.  Feels like she needs to cough when this happens.  She denies any kind of fever/chills/sinus drainage at this time.  Denies any vomiting or diarrhea.  She denies chest pain.  Patient states she is unsure if she is going through the change in this is a hormonal thing.  She does not become symptomatic when she feels the sensation although it is frightening to her this morning.  She does drink a lot of caffeine and has a lot of recent stressful events in her life.    History reviewed. No pertinent past medical history.  There are no problems to display for this patient.   History reviewed. No pertinent surgical history.  Prior to Admission medications   Medication Sig Start Date End Date Taking? Authorizing Provider  famotidine (PEPCID) 40 MG tablet Take 1 tablet (40 mg total) by mouth every evening. 01/14/19 03/15/19  Menshew, Charlesetta Ivory, PA-C  fluticasone (FLONASE) 50 MCG/ACT nasal spray Place 2 sprays into both nostrils daily. 01/14/19   Menshew, Charlesetta Ivory, PA-C  nitrofurantoin, macrocrystal-monohydrate, (MACROBID) 100 MG capsule Take 1 capsule (100 mg total) by mouth 2 (two) times daily. 08/15/19   Lorre Munroe, NP    Allergies Sulfur  History reviewed. No pertinent family history.  Social History Social History   Tobacco Use  . Smoking status: Never Smoker  . Smokeless tobacco: Never Used  Substance Use Topics  . Alcohol use: No  . Drug use: Never    Review of Systems  Constitutional: No fever/chills Eyes: No  visual changes. ENT: No sore throat. Respiratory: Denies cough Cardiovascular: Denies chest pain, complains of heart fluttering Gastrointestinal: Denies abdominal pain Genitourinary: Negative for dysuria. Musculoskeletal: Negative for back pain. Skin: Negative for rash. Psychiatric: no mood changes,     ____________________________________________   PHYSICAL EXAM:  VITAL SIGNS: ED Triage Vitals  Enc Vitals Group     BP 09/20/19 1029 (!) 178/103     Pulse Rate 09/20/19 1029 95     Resp 09/20/19 1029 18     Temp 09/20/19 1029 99.3 F (37.4 C)     Temp Source 09/20/19 1029 Oral     SpO2 09/20/19 1029 99 %     Weight 09/20/19 1031 260 lb (117.9 kg)     Height 09/20/19 1031 5\' 8"  (1.727 m)     Head Circumference --      Peak Flow --      Pain Score 09/20/19 1031 6     Pain Loc --      Pain Edu? --      Excl. in GC? --     Constitutional: Alert and oriented. Well appearing and in no acute distress. Eyes: Conjunctivae are normal.  Head: Atraumatic. Nose: No congestion/rhinnorhea. Mouth/Throat: Mucous membranes are moist.   Neck:  supple no lymphadenopathy noted Cardiovascular: Normal rate, regular rhythm. Heart sounds are normal, occasional PAC heard during auscultation Respiratory: Normal respiratory effort.  No retractions, lungs c t a  GU: deferred Musculoskeletal: FROM all extremities, warm and well perfused Neurologic:  Normal  speech and language.  Skin:  Skin is warm, dry and intact. No rash noted. Psychiatric: Mood and affect are normal. Speech and behavior are normal.  ____________________________________________   LABS (all labs ordered are listed, but only abnormal results are displayed)  Labs Reviewed  COMPREHENSIVE METABOLIC PANEL - Abnormal; Notable for the following components:      Result Value   Chloride 112 (*)    CO2 21 (*)    Calcium 8.6 (*)    All other components within normal limits  CBC WITH DIFFERENTIAL/PLATELET  TSH  TROPONIN I (HIGH  SENSITIVITY)  TROPONIN I (HIGH SENSITIVITY)   ____________________________________________   ____________________________________________  RADIOLOGY    ____________________________________________   PROCEDURES  Procedure(s) performed: EKG shows normal sinus rhythm   Procedures    ____________________________________________   INITIAL IMPRESSION / ASSESSMENT AND PLAN / ED COURSE  Pertinent labs & imaging results that were available during my care of the patient were reviewed by me and considered in my medical decision making (see chart for details).   Patient is a 54 year old female presents emergency department with concerns of heart fluttering at the base of her throat.  See HPI  Physical exam shows patient to appear well.  Vitals normal.  During auscultation several PACs are noted.  Remainder the exam is unremarkable  EKG, labs ordered  DDx: PACs/PVCs, A. fib, atrial flutter, thyroid disease  EKG shows normal sinus rhythm.  Comprehensive metabolic panel is normal, CBC is normal, TSH is normal, troponin is normal.  Explained the findings to the patient.  She is to decrease her caffeine intake.  Follow-up Employee wellness clinic.  She can also follow-up with cardiology.  She was given Dr. Fletcher Anon phone numbers [facility] is on-call.  She was discharged in stable condition.    Sandra Brandt was evaluated in Emergency Department on 09/20/2019 for the symptoms described in the history of present illness. She was evaluated in the context of the global COVID-19 pandemic, which necessitated consideration that the patient might be at risk for infection with the SARS-CoV-2 virus that causes COVID-19. Institutional protocols and algorithms that pertain to the evaluation of patients at risk for COVID-19 are in a state of rapid change based on information released by regulatory bodies including the CDC and federal and state organizations. These policies and algorithms  were followed during the patient's care in the ED.   As part of my medical decision making, I reviewed the following data within the [place] notes reviewed and incorporated, Labs reviewed see above, EKG interpreted NSR, Old chart reviewed, Notes from prior ED visits and [place] Controlled Substance Database  ____________________________________________   FINAL CLINICAL IMPRESSION(S) / ED DIAGNOSES  Final diagnoses:  Palpitations with regular cardiac rhythm      NEW MEDICATIONS STARTED DURING THIS VISIT:  Discharge Medication List as of 09/20/2019  2:05 PM       Note:  This document was prepared using Dragon voice recognition software and may include unintentional dictation errors.    Versie Starks, PA-C 09/20/19 1616    Merlyn Lot, MD 09/21/19 1308

## 2019-10-13 ENCOUNTER — Ambulatory Visit (INDEPENDENT_AMBULATORY_CARE_PROVIDER_SITE_OTHER): Payer: BC Managed Care – PPO | Admitting: Cardiology

## 2019-10-13 ENCOUNTER — Other Ambulatory Visit: Payer: Self-pay

## 2019-10-13 ENCOUNTER — Encounter: Payer: Self-pay | Admitting: Cardiology

## 2019-10-13 VITALS — BP 130/90 | HR 78 | Ht 68.5 in | Wt 258.8 lb

## 2019-10-13 DIAGNOSIS — Z6838 Body mass index (BMI) 38.0-38.9, adult: Secondary | ICD-10-CM

## 2019-10-13 DIAGNOSIS — R0600 Dyspnea, unspecified: Secondary | ICD-10-CM | POA: Diagnosis not present

## 2019-10-13 DIAGNOSIS — R011 Cardiac murmur, unspecified: Secondary | ICD-10-CM | POA: Diagnosis not present

## 2019-10-13 DIAGNOSIS — R0609 Other forms of dyspnea: Secondary | ICD-10-CM

## 2019-10-13 NOTE — Patient Instructions (Addendum)
Medication Instructions:  No medication changes. *If you need a refill on your cardiac medications before your next appointment, please call your pharmacy*  Lab Work: You will need a BMET within 30 days of your Coronary CT.  Your last BMET was 11/18/19.  An order has been placed in the computer for a BMET once yourCoronary CT has been ordered. The [facility] is open Monday through Friday 7 am to 6 pm.  You do not need an appointment.    If you have labs (blood work) drawn today and your tests are completely normal, you will receive your results only by: Marland Kitchen MyChart Message (if you have MyChart) OR . A paper copy in the mail If you have any lab test that is abnormal or we need to change your treatment, we will call you to review the results.  Testing/Procedures:  Your physician has requested that you have an echocardiogram. Echocardiography is a painless test that uses sound waves to create images of your heart. It provides your doctor with information about the size and shape of your heart and how well your heart's chambers and valves are working. This procedure takes approximately one hour. There are no restrictions for this procedure.    Your cardiac CT will be scheduled at one of the below locations:   [facility] [address] [place], [place] [address] [phone] [place] [address] [place], [place] [address] [phone]  If scheduled at [facility], please arrive at the [facility] main entrance of [facility] 30-45 minutes prior to test start time. Proceed to the [facility] Radiology Department (first floor) to check-in and test prep.  If scheduled at [facility], please arrive 15 mins early for check-in and test prep.  Please follow these instructions carefully (unless otherwise directed):   On the Night Before the Test: . Be sure to Drink plenty of  water. . Do not consume any caffeinated/decaffeinated beverages or chocolate 12 hours prior to your test. . Do not take any antihistamines 12 hours prior to your test.  On the Day of the Test: . Drink plenty of water. Do not drink any water within one hour of the test. . Do not eat any food 4 hours prior to the test. . You may take your regular medications prior to the test.  . Take metoprolol (Lopressor) two hours prior to test. . HOLD Furosemide/Hydrochlorothiazide morning of the test. . FEMALES- please wear underwire-free bra if available       After the Test: . Drink plenty of water. . After receiving IV contrast, you may experience a mild flushed feeling. This is normal. . On occasion, you may experience a mild rash up to 24 hours after the test. This is not dangerous. If this occurs, you can take Benadryl 25 mg and increase your fluid intake. . If you experience trouble breathing, this can be serious. If it is severe call 911 IMMEDIATELY. If it is mild, please call our office. . If you take any of these medications: Glipizide/Metformin, Avandament, Glucavance, please do not take 48 hours after completing test unless otherwise instructed.   Once we have confirmed authorization from your insurance company, we will call you to set up a date and time for your test.   For non-scheduling related questions, please contact the cardiac imaging nurse navigator should you have any questions/concerns: Marchia Bond, RN Navigator Cardiac Imaging [facility] [phone] Office    Follow-Up: At [facility], you and your health needs are our priority.  As part of our continuing mission to provide you with exceptional heart care, we have created designated Provider Care Teams.  These Care Teams include your primary Cardiologist (physician) and Advanced Practice Providers (APPs -  Physician Assistants and Nurse Practitioners) who all work together to provide you with  the care you need, when you need it.  Your next appointment:   3 month(s)  The format for your next appointment:   In Person  Provider:   Debbe Odea, MD  Other Instructions  CT Angiogram  A CT angiogram is a procedure to look at the blood vessels in various areas of the body. For this procedure, a large X-ray machine, called a CT scanner, takes detailed pictures of blood vessels that have been injected with a dye (contrast material). A CT angiogram allows your health care provider to see how well blood is flowing to the area of your body that is being checked. Your health care provider will be able to see if there are any problems, such as a blockage. Tell a health care provider about:  Any allergies you have.  All medicines you are taking, including vitamins, herbs, eye drops, creams, and over-the-counter medicines.  Any problems you or family members have had with anesthetic medicines.  Any blood disorders you have.  Any surgeries you have had.  Any medical conditions you have.  Whether you are pregnant or may be pregnant.  Whether you are breastfeeding.  Any anxiety disorders, chronic pain, or other conditions you have that may increase your stress or prevent you from lying still. What are the risks? Generally, this is a safe procedure. However, problems may occur, including:  Infection.  Bleeding.  Allergic reactions to medicines or dyes.  Damage to other structures or organs.  Kidney damage from the dye or contrast that is used.  Increased risk of cancer from radiation exposure. This risk is low. Talk with your health care provider about: ? The risks and benefits of testing. ? How you can receive the lowest dose of radiation. What happens before the procedure?  Wear comfortable clothing and remove any jewelry.  Follow instructions from your health care provider about eating and drinking. For most people, instructions may include these  actions: ? For 12 hours before the test, avoid caffeine. This includes tea, coffee, soda, and energy drinks or pills. ? For 3-4 hours before the test, stop eating or drinking anything but water. ? Stay well hydrated by continuing to drink water before the exam. This will help to clear the contrast dye from your body after the test.  Ask your health care provider about changing or stopping your regular medicines. This is especially important if you are taking diabetes medicines or blood thinners. What happens during the procedure?  An IV tube will be inserted into one of your veins.  You will be asked to lie on an exam table. This table will slide in and out of the CT machine during the procedure.  Contrast dye will be injected into the IV tube. You might feel warm, or you may get a metallic taste in your mouth.  The table that you are lying on will move into the CT machine tunnel for the scan.  The person running the machine will give you instructions while the scans are being done. You may be asked to: ? Keep your arms  above your head. ? Hold your breath. ? Stay very still, even if the table is moving.  When the scanning is complete, you will be moved out of the machine.  The IV tube will be removed. The procedure may vary among health care providers and hospitals. What happens after the procedure?  You might feel warm, or you may get a metallic taste in your mouth.  You may be asked to drink water or other fluids to wash (flush) the contrast material out of your body.  It is up to you to get the results of your procedure. Ask your health care provider, or the department that is doing the procedure, when your results will be ready. Summary  A CT angiogram is a procedure to look at the blood vessels in various areas of the body.  You will need to stay very still during the exam.  You may be asked to drink water or other fluids to wash (flush) the contrast material out of your  body after your scan. This information is not intended to replace advice given to you by your health care provider. Make sure you discuss any questions you have with your health care provider. Document Revised: 11/13/2018 Document Reviewed: 05/03/2016 Elsevier Patient Education  2020 [employer].

## 2019-10-13 NOTE — Progress Notes (Signed)
Cardiology Office Note:    Date:  10/14/2019   ID:  Sandra Brandt, DOB 11/24/1965, MRN [id number]  PCP:  Patient, No Pcp Per  Cardiologist:  Debbe Odea, MD  Electrophysiologist:  None   Referring MD: Willy Eddy, MD   Chief Complaint  Patient presents with  . New Patient (Initial Visit)    Follow up from [facility] ER; shortness of breath & PAC's. Meds reviewed by the pt. verbally. Pt. c/o feeling anxious and has shortness of breath.     History of Present Illness:    Sandra Brandt is a 54 y.o. female with a hx of GERD who presents due to worsening dyspnea on exertion.  Patient states symptoms of shortness of breath began roughly a month ago and typically occurs when she exerts herself such as going up stairs or walking fast.  She denies chest pain but states having occasional palpitations when she is out of breath because she gets really nervous.  Symptoms seem to have worsened. She was seen in the emergency room on 09/20/2019 due to the symptoms.  EKG was normal.  High-sensitivity troponins were normal.  She was discharged with recommendation for cardiology follow-up.  She denies edema, orthopnea, presyncope or syncope.  Past Medical History:  Diagnosis Date  . GERD (gastroesophageal reflux disease)     History reviewed. No pertinent surgical history.  Current Medications: Current Meds  Medication Sig  . famotidine (PEPCID) 40 MG tablet Take 1 tablet (40 mg total) by mouth every evening.  . fluticasone (FLONASE) 50 MCG/ACT nasal spray Place 2 sprays into both nostrils daily.     Allergies:   Sulfur   Social History   Socioeconomic History  . Marital status: Married    Spouse name: Not on file  . Number of children: Not on file  . Years of education: Not on file  . Highest education level: Not on file  Occupational History  . Not on file  Tobacco Use  . Smoking status: Former Smoker    Packs/day: 6.00    Types: Cigarettes    Quit date: 12/05/2018    Years  since quitting: 0.8  . Smokeless tobacco: Never Used  . Tobacco comment: social smoker   Substance and Sexual Activity  . Alcohol use: No  . Drug use: Never  . Sexual activity: Not on file  Other Topics Concern  . Not on file  Social History Narrative  . Not on file   Social Determinants of Health   Financial Resource Strain:   . Difficulty of Paying Living Expenses: Not on file  Food Insecurity:   . Worried About Programme researcher, broadcasting/film/video in the Last Year: Not on file  . Ran Out of Food in the Last Year: Not on file  Transportation Needs:   . Lack of Transportation (Medical): Not on file  . Lack of Transportation (Non-Medical): Not on file  Physical Activity:   . Days of Exercise per Week: Not on file  . Minutes of Exercise per Session: Not on file  Stress:   . Feeling of Stress : Not on file  Social Connections:   . Frequency of Communication with Friends and Family: Not on file  . Frequency of Social Gatherings with Friends and Family: Not on file  . Attends Religious Services: Not on file  . Active Member of Clubs or Organizations: Not on file  . Attends Banker Meetings: Not on file  . Marital Status: Not on file  Family History: The patient's family history includes Cancer in her mother; Diabetes in her brother; Heart disease (age of onset: 23) in her mother; Hyperlipidemia in her mother and sister; Hypertension in her brother, mother, and sister.  ROS:   Please see the history of present illness.     All other systems reviewed and are negative.  EKGs/Labs/Other Studies Reviewed:    The following studies were reviewed today:   EKG:  EKG is  ordered today.  The ekg ordered today demonstrates normal sinus rhythm, QT prolongation of 465 ms.  Recent Labs: 09/20/2019: ALT 21; BUN 19; Creatinine, Ser 0.91; Hemoglobin 12.3; Platelets 209; Potassium 3.7; Sodium 143; TSH 1.862  Recent Lipid Panel No results found for: CHOL, TRIG, HDL, CHOLHDL, VLDL, LDLCALC,  LDLDIRECT  Physical Exam:    VS:  BP 130/90 (BP Location: Left Arm, Patient Position: Sitting, Cuff Size: Large)   Pulse 78   Ht 5' 8.5" (1.74 m)   Wt 258 lb 12 oz (117.4 kg)   SpO2 98%   BMI 38.77 kg/m     Wt Readings from Last 3 Encounters:  10/13/19 258 lb 12 oz (117.4 kg)  09/20/19 260 lb (117.9 kg)  08/15/19 194 lb (88 kg)     GEN:  Well nourished, well developed in no acute distress HEENT: Normal NECK: No JVD; No carotid bruits LYMPHATICS: No lymphadenopathy CARDIAC: RRR, 2/6 systolic murmur RUSB, rubs, gallops RESPIRATORY:  Clear to auscultation without rales, wheezing or rhonchi  ABDOMEN: Soft, non-tender, non-distended MUSCULOSKELETAL:  No edema; No deformity  SKIN: Warm and dry NEUROLOGIC:  Alert and oriented x 3 PSYCHIATRIC:  Normal affect   ASSESSMENT:    1. Dyspnea on exertion   2. Heart murmur, systolic   3. BMI 38.0-38.9,adult    PLAN:    In order of problems listed above:  1. Patient with worsening dyspnea on exertion.  Consistent with an anginal equivalent.  Will get echocardiogram to evaluate cardiac function.  Get coronary CTA to evaluate coronary artery disease. 2. Systolic murmur noted on exam.  Possible aortic sclerosis versus stenosis.  Get echocardiogram as above. 3. Obesity.  Weight loss advised.  Follow-up after above testing.  This note was generated in part or whole with voice recognition software. Voice recognition is usually quite accurate but there are transcription errors that can and very often do occur. I apologize for any typographical errors that were not detected and corrected.  Medication Adjustments/Labs and Tests Ordered: Current medicines are reviewed at length with the patient today.  Concerns regarding medicines are outlined above.  Orders Placed This Encounter  Procedures  . CT CORONARY FRACTIONAL FLOW RESERVE DATA PREP  . CT CORONARY FRACTIONAL FLOW RESERVE FLUID ANALYSIS  . CT CORONARY MORPH W/CTA COR W/SCORE W/CA  W/CM &/OR WO/CM  . Basic Metabolic Panel (BMET)  . EKG 12-Lead  . ECHOCARDIOGRAM COMPLETE   No orders of the defined types were placed in this encounter.   Patient Instructions  Medication Instructions:  No medication changes. *If you need a refill on your cardiac medications before your next appointment, please call your pharmacy*  Lab Work: You will need a BMET within 30 days of your Coronary CT.  Your last BMET was 11/18/19.  An order has been placed in the computer for a BMET once yourCoronary CT has been ordered. The [facility] is open Monday through Friday 7 am to 6 pm.  You do not need an appointment.    If you have labs (blood  work) drawn today and your tests are completely normal, you will receive your results only by: Marland Kitchen. MyChart Message (if you have MyChart) OR . A paper copy in the mail If you have any lab test that is abnormal or we need to change your treatment, we will call you to review the results.  Testing/Procedures:  Your physician has requested that you have an echocardiogram. Echocardiography is a painless test that uses sound waves to create images of your heart. It provides your doctor with information about the size and shape of your heart and how well your heart's chambers and valves are working. This procedure takes approximately one hour. There are no restrictions for this procedure.    Your cardiac CT will be scheduled at one of the below locations:   [facility] [address] [place], [place] [address] [phone]  OR  [facility] [address] [place], [place] [address] [phone]  If scheduled at [facility], please arrive at the [facility] main entrance of [facility] 30-45 minutes prior to test start time. Proceed to the [facility] Rad[facility] Department (first floor) to check-in and test prep.  If scheduled at [facility], please arrive  15 mins early for check-in and test prep.  Please follow these instructions carefully (unless otherwise directed):   On the Night Before the Test: . Be sure to Drink plenty of water. . Do not consume any caffeinated/decaffeinated beverages or chocolate 12 hours prior to your test. . Do not take any antihistamines 12 hours prior to your test.  On the Day of the Test: . Drink plenty of water. Do not drink any water within one hour of the test. . Do not eat any food 4 hours prior to the test. . You may take your regular medications prior to the test.  . Take metoprolol (Lopressor) two hours prior to test. . HOLD Furosemide/Hydrochlorothiazide morning of the test. . FEMALES- please wear underwire-free bra if available       After the Test: . Drink plenty of water. . After receiving IV contrast, you may experience a mild flushed feeling. This is normal. . On occasion, you may experience a mild rash up to 24 hours after the test. This is not dangerous. If this occurs, you can take Benadryl 25 mg and increase your fluid intake. . If you experience trouble breathing, this can be serious. If it is severe call 911 IMMEDIATELY. If it is mild, please call our office. . If you take any of these medications: Glipizide/Metformin, Avandament, Glucavance, please do not take 48 hours after completing test unless otherwise instructed.   Once we have confirmed authorization from your insurance company, we will call you to set up a date and time for your test.   For non-scheduling related questions, please contact the cardiac imaging nurse navigator should you have any questions/concerns: Rockwell AlexandriaSara Wallace, RN Navigator Cardiac Imaging [facility] [phone] Office    Follow-Up: At [facility], you and your health needs are our priority.  As part of our continuing mission to provide you with exceptional heart care, we have created designated Provider Care Teams.  These Care  Teams include your primary Cardiologist (physician) and Advanced Practice Providers (APPs -  Physician Assistants and Nurse Practitioners) who all work together to provide you with the care you need, when you need it.  Your next appointment:   3 month(s)  The  format for your next appointment:   In Person  Provider:   Kate Sable, MD  Other Instructions  CT Angiogram  A CT angiogram is a procedure to look at the blood vessels in various areas of the body. For this procedure, a large X-ray machine, called a CT scanner, takes detailed pictures of blood vessels that have been injected with a dye (contrast material). A CT angiogram allows your health care provider to see how well blood is flowing to the area of your body that is being checked. Your health care provider will be able to see if there are any problems, such as a blockage. Tell a health care provider about:  Any allergies you have.  All medicines you are taking, including vitamins, herbs, eye drops, creams, and over-the-counter medicines.  Any problems you or family members have had with anesthetic medicines.  Any blood disorders you have.  Any surgeries you have had.  Any medical conditions you have.  Whether you are pregnant or may be pregnant.  Whether you are breastfeeding.  Any anxiety disorders, chronic pain, or other conditions you have that may increase your stress or prevent you from lying still. What are the risks? Generally, this is a safe procedure. However, problems may occur, including:  Infection.  Bleeding.  Allergic reactions to medicines or dyes.  Damage to other structures or organs.  Kidney damage from the dye or contrast that is used.  Increased risk of cancer from radiation exposure. This risk is low. Talk with your health care provider about: ? The risks and benefits of testing. ? How you can receive the lowest dose of radiation. What happens before the procedure?  Wear  comfortable clothing and remove any jewelry.  Follow instructions from your health care provider about eating and drinking. For most people, instructions may include these actions: ? For 12 hours before the test, avoid caffeine. This includes tea, coffee, soda, and energy drinks or pills. ? For 3-4 hours before the test, stop eating or drinking anything but water. ? Stay well hydrated by continuing to drink water before the exam. This will help to clear the contrast dye from your body after the test.  Ask your health care provider about changing or stopping your regular medicines. This is especially important if you are taking diabetes medicines or blood thinners. What happens during the procedure?  An IV tube will be inserted into one of your veins.  You will be asked to lie on an exam table. This table will slide in and out of the CT machine during the procedure.  Contrast dye will be injected into the IV tube. You might feel warm, or you may get a metallic taste in your mouth.  The table that you are lying on will move into the CT machine tunnel for the scan.  The person running the machine will give you instructions while the scans are being done. You may be asked to: ? Keep your arms above your head. ? Hold your breath. ? Stay very still, even if the table is moving.  When the scanning is complete, you will be moved out of the machine.  The IV tube will be removed. The procedure may vary among health care providers and hospitals. What happens after the procedure?  You might feel warm, or you may get a metallic taste in your mouth.  You may be asked to drink water or other fluids to wash (flush) the contrast material out of your body.  It is up to you to get the results of your procedure. Ask your health care provider, or the department that is doing the procedure, when your results will be ready. Summary  A CT angiogram is a procedure to look at the blood vessels in various  areas of the body.  You will need to stay very still during the exam.  You may be asked to drink water or other fluids to wash (flush) the contrast material out of your body after your scan. This information is not intended to replace advice given to you by your health care provider. Make sure you discuss any questions you have with your health care provider. Document Revised: 11/13/2018 Document Reviewed: 05/03/2016 Elsevier Patient Education  2020 [employer].      Signed, Debbe Odea, MD  10/14/2019 11:01 AM    [facility]

## 2019-11-04 ENCOUNTER — Ambulatory Visit (INDEPENDENT_AMBULATORY_CARE_PROVIDER_SITE_OTHER): Payer: BC Managed Care – PPO

## 2019-11-04 ENCOUNTER — Other Ambulatory Visit: Payer: Self-pay

## 2019-11-04 DIAGNOSIS — R0600 Dyspnea, unspecified: Secondary | ICD-10-CM | POA: Diagnosis not present

## 2019-11-04 DIAGNOSIS — R0609 Other forms of dyspnea: Secondary | ICD-10-CM

## 2019-11-04 DIAGNOSIS — Z6838 Body mass index (BMI) 38.0-38.9, adult: Secondary | ICD-10-CM | POA: Diagnosis not present

## 2019-11-04 DIAGNOSIS — R011 Cardiac murmur, unspecified: Secondary | ICD-10-CM | POA: Diagnosis not present

## 2019-12-02 ENCOUNTER — Telehealth (HOSPITAL_COMMUNITY): Payer: Self-pay | Admitting: Emergency Medicine

## 2019-12-02 ENCOUNTER — Telehealth: Payer: Self-pay | Admitting: Cardiology

## 2019-12-02 ENCOUNTER — Ambulatory Visit (HOSPITAL_COMMUNITY): Payer: BC Managed Care – PPO

## 2019-12-02 MED ORDER — METOPROLOL TARTRATE 100 MG PO TABS: ORAL_TABLET | ORAL | 0 refills | Status: DC

## 2019-12-02 NOTE — Addendum Note (Signed)
Addended by: Stann Mainland on: 12/02/2019 09:00 AM   Modules accepted: Orders

## 2019-12-02 NOTE — Telephone Encounter (Signed)
I was approached by Dr. Azucena Cecil this morning in the clinic. He advised he had received a call that this patient is scheduled for her Cardiac CTA tomorrow and was not prescribed metoprolol tartrate 100 mg to take prior to her scan. He advised she will need to take this and asked that I send this RX in for her.  I have called and spoken with the patient and advised her she will need to take metoprolol tartrate 100 mg x 1 dose 2 hours prior to her Cardiac CTA tomorrow.  I have confirmed her pharmacy and stated she will need to pick this RX up today. She is aware this is used as a heart rate lowering agent in order to obtain optimal images from her CT scan.  The patient voices understanding and is agreeable.

## 2019-12-02 NOTE — Telephone Encounter (Signed)
no answer, VM box full and cannot leave new messages

## 2019-12-03 ENCOUNTER — Ambulatory Visit
Admission: RE | Admit: 2019-12-03 | Discharge: 2019-12-03 | Disposition: A | Discharge status: Home / Self Care | Payer: BC Managed Care – PPO | Source: Ambulatory Visit | Attending: Cardiology | Admitting: Cardiology

## 2019-12-03 ENCOUNTER — Other Ambulatory Visit: Payer: Self-pay

## 2019-12-03 DIAGNOSIS — R0600 Dyspnea, unspecified: Secondary | ICD-10-CM | POA: Diagnosis not present

## 2019-12-03 DIAGNOSIS — Z6838 Body mass index (BMI) 38.0-38.9, adult: Secondary | ICD-10-CM | POA: Insufficient documentation

## 2019-12-03 DIAGNOSIS — R011 Cardiac murmur, unspecified: Secondary | ICD-10-CM | POA: Insufficient documentation

## 2019-12-03 DIAGNOSIS — R0609 Other forms of dyspnea: Secondary | ICD-10-CM

## 2019-12-03 LAB — POCT I-STAT CREATININE: Creatinine, Ser: 0.9 mg/dL (ref 0.44–1.00)

## 2019-12-03 MED ORDER — NITROGLYCERIN 0.4 MG SL SUBL
0.8000 mg | SUBLINGUAL_TABLET | Freq: Once | SUBLINGUAL | Status: AC
  Administered 2019-12-03: 0.8 mg via SUBLINGUAL

## 2019-12-03 MED ORDER — IOHEXOL 350 MG/ML SOLN
125.0000 mL | Freq: Once | INTRAVENOUS | Status: AC | PRN
  Administered 2019-12-03: 125 mL via INTRAVENOUS

## 2019-12-04 ENCOUNTER — Telehealth: Payer: Self-pay

## 2019-12-04 NOTE — Telephone Encounter (Signed)
Called to give the patient Cardiac CTA results. Unable to lmtcb. Patients voicemail is full.

## 2019-12-04 NOTE — Telephone Encounter (Signed)
-----   Message from Debbe Odea, MD sent at 12/04/2019  1:27 PM EDT ----- Coronary calcium score of 0.  This means patient has a low risk for near-term coronary events.  No evidence of CAD noted on coronary CTA.  Artifacts degraded quality of study.  Keep follow-up appointment.

## 2019-12-07 NOTE — Telephone Encounter (Signed)
Results called to pt. Pt verbalized understanding.  

## 2019-12-07 NOTE — Telephone Encounter (Signed)
Patient calling to discuss recent CTA testing results   Please call this morning as patient works 3rd shift.    Patient was sent a link via text for mychart and is agreeable that if she cannot be reached by phone that she can use this as a communication tool.

## 2020-01-14 ENCOUNTER — Ambulatory Visit: Payer: BC Managed Care – PPO | Admitting: Cardiology

## 2020-01-15 ENCOUNTER — Encounter: Payer: Self-pay | Admitting: Cardiology

## 2020-06-24 ENCOUNTER — Ambulatory Visit (INDEPENDENT_AMBULATORY_CARE_PROVIDER_SITE_OTHER): Payer: BC Managed Care – PPO

## 2020-06-24 ENCOUNTER — Other Ambulatory Visit: Payer: Self-pay

## 2020-06-24 ENCOUNTER — Encounter: Payer: Self-pay | Admitting: Podiatry

## 2020-06-24 ENCOUNTER — Ambulatory Visit: Payer: BC Managed Care – PPO | Admitting: Podiatry

## 2020-06-24 DIAGNOSIS — M722 Plantar fascial fibromatosis: Secondary | ICD-10-CM

## 2020-06-24 MED ORDER — MELOXICAM 15 MG PO TABS: 15.0000 mg | ORAL_TABLET | Freq: Every day | ORAL | 1 refills | Status: DC

## 2020-06-24 MED ORDER — METHYLPREDNISOLONE 4 MG PO TBPK: ORAL_TABLET | ORAL | 0 refills | Status: DC

## 2020-06-24 NOTE — Progress Notes (Signed)
   Subjective: 54 y.o. female presenting as a new patient for evaluation of bilateral heel pain is been going on for approximately 2 months now.  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  She has tried OTC insoles and soaking her feet in Epsom salt.  She presents for further treatment evaluation   Past Medical History:  Diagnosis Date  . GERD (gastroesophageal reflux disease)      Objective: Physical Exam General: The patient is alert and oriented x3 in no acute distress.  Dermatology: Skin is warm, dry and supple bilateral lower extremities. Negative for open lesions or macerations bilateral.  Small corn lesion also noted to the left second toe medial aspect that is irritating with touch  Vascular: Dorsalis Pedis and Posterior Tibial pulses palpable bilateral.  Capillary fill time is immediate to all digits.  Neurological: Epicritic and protective threshold intact bilateral.   Musculoskeletal: Tenderness to palpation to the plantar aspect of the bilateral heels along the plantar fascia. All other joints range of motion within normal limits bilateral. Strength 5/5 in all groups bilateral.   Radiographic exam: Normal osseous mineralization. Joint spaces preserved. No fracture/dislocation/boney destruction. No other soft tissue abnormalities or radiopaque foreign bodies.   Assessment: 1. plantar fasciitis bilateral feet 2.  Corn second digit left  Plan of Care:  1. Patient evaluated. Xrays reviewed.   2. Injection of 0.5cc Celestone soluspan injected into the bilateral heels.  3. Rx for Medrol Dose Pak placed 4. Rx for Meloxicam ordered for patient. 5.  OTC power step insoles provided.  Recommend new balance shoes or Asics 6. Instructed patient regarding therapies and modalities at home to alleviate symptoms.  7. Return to clinic in 4 weeks for plantar fascial follow-up and to address excision of the corn lesion of the second  toe  [name], DPM [facility]  Dr. [name], DPM    [address] [place], [place] [address]                Office [phone]  Fax [phone]

## 2020-07-07 ENCOUNTER — Other Ambulatory Visit: Payer: Self-pay

## 2020-07-07 ENCOUNTER — Ambulatory Visit: Payer: BC Managed Care – PPO | Admitting: Nurse Practitioner

## 2020-07-07 VITALS — BP 191/102 | Temp 97.1°F | Resp 16 | Ht 67.5 in | Wt 259.0 lb

## 2020-07-07 DIAGNOSIS — I10 Essential (primary) hypertension: Secondary | ICD-10-CM

## 2020-07-07 MED ORDER — FLUTICASONE PROPIONATE 50 MCG/ACT NA SUSP: 2.0000 | Freq: Every day | NASAL | 6 refills | Status: DC

## 2020-07-07 NOTE — Patient Instructions (Signed)
°  Flonase prescription at [facility] from Claritin to Zyrtec (take before bed)  Get insoles for tennis shoes  Wear crocs or other shoes in house/at all times  Frozen water bottle for pain relief to foot   Follow up with Foot/Ankle clinic  We will schedule you with Cardiology for follow up  Take Blood pressure at home after rest and keep a record of your blood pressure numbers for your follow up with cardiology   Normal Blood Pressure is 120/80

## 2020-07-07 NOTE — Progress Notes (Signed)
   Subjective:    Patient ID: Sandra Brandt, female    DOB: 10/19/1965, 54 y.o.   MRN: [id number]  HPI 54 year old African American Female presenting with complaints of fullness in her right ear. She does suffer from allergies and takes Claritin daily. 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   She does not have a PCP and has been in the ER several times in the past year for various primary care issues. Each visit to the ER shows documentation of elevated BP: 06/2015 (163/94) 08/2017 (176/91) 12/2018   (186/113) 07/2019 (173/100) 09/2019   (154/78) Today in office  Today's Vitals   07/07/20 1029  BP: (!) 191/102  Resp: 16  Temp: (!) 97.1 F (36.2 C)  TempSrc: Temporal  SpO2: 100%  Weight: 259 lb (117.5 kg)  Height: 5' 7.5" (1.715 m)   Body mass index is 39.97 kg/m.   She is also seeing [facility] for plantar fasciatus for which she has had steroidal injections in her feet bilaterally and takes Mobic daily.   Patient states that her mother had HTN and is deceased, passed away from CA.   Review of Systems  Constitutional: Negative.   HENT: Positive for congestion, ear pain and rhinorrhea.   Eyes: Negative.   Respiratory: Negative.   Cardiovascular: Negative.   Musculoskeletal: Positive for arthralgias.       Objective:   Physical Exam Constitutional:      Appearance: Normal appearance.  HENT:     Right Ear: Hearing, ear canal and external ear normal. A middle ear effusion is present.     Left Ear: Hearing, ear canal and external ear normal.     Nose:     Right Turbinates: Enlarged and swollen.     Left Turbinates: Enlarged and swollen.  Cardiovascular:     Rate and Rhythm: Normal rate and regular rhythm.     Heart sounds: Murmur heard.   Pulmonary:     Effort: Pulmonary effort is normal.     Breath sounds: Normal breath sounds.  Skin:    General: Skin is warm and dry.  Neurological:     Mental Status:  She is alert.    Past Medical History:  Diagnosis Date  . GERD (gastroesophageal reflux disease)          Assessment & Plan:  Though patient is in office today regarding sinus and ear irritation, spent the majority of the visit discussing her blood pressure and her need for a primary care doctor.   Since she has seen Cardiology recently will initiate a follow up with them to address blood pressure. She will be seen by [facility] tomorrow at 9:30. Will fax labs and today's note  Instructed patient to take blood pressure at home and record readings. Discussed low salt diet. Collected labs today to evaluate kidney/liver function and to establish baseline lipid levels and Cardiac Risk Factors.   For ongoing nasal congestion discussed starting flonase, switching to Zyrtec and using that before bed. Changing mask often while working. Avoid decongestants.   Meds ordered this encounter  Medications  . fluticasone (FLONASE) 50 MCG/ACT nasal spray    Sig: Place 2 sprays into both nostrils daily.    Dispense:  16 g    Refill:  6

## 2020-07-07 NOTE — Progress Notes (Signed)
Rechecked blood pressure Manual bp in right arm 190/108

## 2020-07-08 ENCOUNTER — Ambulatory Visit (INDEPENDENT_AMBULATORY_CARE_PROVIDER_SITE_OTHER): Payer: BC Managed Care – PPO | Admitting: Family

## 2020-07-08 ENCOUNTER — Encounter: Payer: Self-pay | Admitting: Family

## 2020-07-08 VITALS — BP 190/108 | HR 72 | Ht 68.0 in | Wt 258.0 lb

## 2020-07-08 DIAGNOSIS — E782 Mixed hyperlipidemia: Secondary | ICD-10-CM

## 2020-07-08 DIAGNOSIS — I700 Atherosclerosis of aorta: Secondary | ICD-10-CM | POA: Diagnosis not present

## 2020-07-08 DIAGNOSIS — I10 Essential (primary) hypertension: Secondary | ICD-10-CM

## 2020-07-08 LAB — COMPREHENSIVE METABOLIC PANEL
ALT: 15 IU/L (ref 0–32)
AST: 13 IU/L (ref 0–40)
Albumin/Globulin Ratio: 1.5 (ref 1.2–2.2)
Albumin: 4.4 g/dL (ref 3.8–4.9)
Alkaline Phosphatase: 106 IU/L (ref 44–121)
BUN/Creatinine Ratio: 19 (ref 9–23)
BUN: 18 mg/dL (ref 6–24)
Bilirubin Total: <0.2 mg/dL (ref 0.0–1.2)
CO2: 22 mmol/L (ref 20–29)
Calcium: 9.4 mg/dL (ref 8.7–10.2)
Chloride: 106 mmol/L (ref 96–106)
Creatinine, Ser: 0.93 mg/dL (ref 0.57–1.00)
GFR calc Af Amer: 81 mL/min/{1.73_m2} (ref >59)
GFR calc non Af Amer: 70 mL/min/{1.73_m2} (ref >59)
Globulin, Total: 3 g/dL (ref 1.5–4.5)
Glucose: 87 mg/dL (ref 65–99)
Potassium: 5 mmol/L (ref 3.5–5.2)
Sodium: 141 mmol/L (ref 134–144)
Total Protein: 7.4 g/dL (ref 6.0–8.5)

## 2020-07-08 LAB — LIPID PANEL WITH LDL/HDL RATIO
Cholesterol, Total: 189 mg/dL (ref 100–199)
HDL: 56 mg/dL (ref >39)
LDL Chol Calc (NIH): 114 mg/dL — ABNORMAL HIGH (ref 0–99)
LDL/HDL Ratio: 2 ratio (ref 0.0–3.2)
Triglycerides: 106 mg/dL (ref 0–149)
VLDL Cholesterol Cal: 19 mg/dL (ref 5–40)

## 2020-07-08 LAB — CBC WITH DIFFERENTIAL/PLATELET
Basophils Absolute: 0 10*3/uL (ref 0.0–0.2)
Basos: 0 %
EOS (ABSOLUTE): 0.1 10*3/uL (ref 0.0–0.4)
Eos: 1 %
Hematocrit: 36.9 % (ref 34.0–46.6)
Hemoglobin: 12.4 g/dL (ref 11.1–15.9)
Immature Grans (Abs): 0 10*3/uL (ref 0.0–0.1)
Immature Granulocytes: 0 %
Lymphocytes Absolute: 2.4 10*3/uL (ref 0.7–3.1)
Lymphs: 29 %
MCH: 31.1 pg (ref 26.6–33.0)
MCHC: 33.6 g/dL (ref 31.5–35.7)
MCV: 93 fL (ref 79–97)
Monocytes Absolute: 0.6 10*3/uL (ref 0.1–0.9)
Monocytes: 7 %
Neutrophils Absolute: 5.1 10*3/uL (ref 1.4–7.0)
Neutrophils: 63 %
Platelets: 257 10*3/uL (ref 150–450)
RBC: 3.99 x10E6/uL (ref 3.77–5.28)
RDW: 13.7 % (ref 11.7–15.4)
WBC: 8.1 10*3/uL (ref 3.4–10.8)

## 2020-07-08 MED ORDER — CLONIDINE HCL 0.1 MG PO TABS
0.2000 mg | ORAL_TABLET | Freq: Once | ORAL | Status: AC
  Administered 2020-07-08: 0.2 mg via ORAL

## 2020-07-08 MED ORDER — LOSARTAN POTASSIUM-HCTZ 50-12.5 MG PO TABS: 1.0000 | ORAL_TABLET | Freq: Every day | ORAL | 2 refills | Status: DC

## 2020-07-08 NOTE — Patient Instructions (Addendum)
Medication Instructions:  Your physician has recommended you make the following change in your medication:   START Losartan-Hydrochlorothiazide (Hyzaar) 50-12.5mg  one tablet daily  *This is a generic medication and well covered by insurance programs. It should be inexpensive when you pick it up at the pharmacy. It is also on the [employer] $4 list.*  *If you need a refill on your cardiac medications before your next appointment, please call your pharmacy*  Lab Work: No lab work today.  Your lab work from your visit yesterday showed normal kidney and liver function and normal electrolytes.   Testing/Procedures: Your EKG today shows normal sinus rhythm which is a great result!  Follow-Up: At [facility], you and your health needs are our priority.  As part of our continuing mission to provide you with exceptional heart care, we have created designated Provider Care Teams.  These Care Teams include your primary Cardiologist (physician) and Advanced Practice Providers (APPs -  Physician Assistants and Nurse Practitioners) who all work together to provide you with the care you need, when you need it.  We recommend signing up for the patient portal called "MyChart".  Sign up information is provided on this After Visit Summary.  MyChart is used to connect with patients for Virtual Visits (Telemedicine).  Patients are able to view lab/test results, encounter notes, upcoming appointments, etc.  Non-urgent messages can be sent to your provider as well.   To learn more about what you can do with MyChart, go to [email or web address].    Your next appointment:   2 week(s)  The format for your next appointment:   In Person  Provider:   You may see Debbe Odea, MD or one of the following Advanced Practice Providers on your designated Care Team:    Nicolasa Ducking, NP  Eula Listen, PA-C  Marisue Ivan, PA-C  Gillian Shields, NP  Cadence Fransico Michael, [place]  Other  Instructions   Recommend checking your blood pressure at home once per day at least two hours after taking your medication.   Tips to Measure your Blood Pressure Correctly  To determine whether you have hypertension, a medical professional will take a blood pressure reading. How you prepare for the test, the position of your arm, and other factors can change a blood pressure reading by 10% or more. That could be enough to hide high blood pressure, start you on a drug you don't really need, or lead your doctor to incorrectly adjust your medications.  Here's what you can do to ensure a correct reading: . Don't drink a caffeinated beverage or smoke during the 30 minutes before the test. . Sit quietly for five minutes before the test begins. . During the measurement, sit in a chair with your feet on the floor and your arm supported so your elbow is at about heart level. . The inflatable part of the cuff should completely cover at least 80% of your upper arm, and the cuff should be placed on bare skin, not over a shirt. . Don't talk during the measurement. . Have your blood pressure measured twice, with a brief break in between. If the readings are different by 5 points or more, have it done a third time.  In 2017, new guidelines from the American Heart Association, the [employer] of Cardiology, and nine other health organizations lowered the diagnosis of high blood pressure to 130/80 mm Hg or higher for all adults. The guidelines also redefined the various blood pressure categories to now include normal, elevated,  Stage 1 hypertension, Stage 2 hypertension, and hypertensive crisis (see "Blood pressure categories").  Blood pressure categories  Blood pressure category SYSTOLIC (upper number)  DIASTOLIC (lower number)  Normal Less than 120 mm Hg and Less than 80 mm Hg  Elevated 120-129 mm Hg and Less than 80 mm Hg  High blood pressure: Stage 1 hypertension 130-139 mm Hg or 80-89 mm Hg  High  blood pressure: Stage 2 hypertension 140 mm Hg or higher or 90 mm Hg or higher  Hypertensive crisis (consult your doctor immediately) Higher than 180 mm Hg and/or Higher than 120 mm Hg  Source: American Heart Association and American Stroke Association. For more on getting your blood pressure under control, buy Controlling Your Blood Pressure, a Special Health Report from [facility].   Blood Pressure Log   Date   Time  Blood Pressure  Position  Example: Nov 1 9 AM 124/78 sitting                                                    Hypertension, Adult Hypertension is another name for high blood pressure. High blood pressure forces your heart to work harder to pump blood. This can cause problems over time. There are two numbers in a blood pressure reading. There is a top number (systolic) over a bottom number (diastolic). It is best to have a blood pressure that is below 120/80. Healthy choices can help lower your blood pressure, or you may need medicine to help lower it. What are the causes? The cause of this condition is not known. Some conditions may be related to high blood pressure. What increases the risk?  Smoking.  Having type 2 diabetes mellitus, high cholesterol, or both.  Not getting enough exercise or physical activity.  Being overweight.  Having too much fat, sugar, calories, or salt (sodium) in your diet.  Drinking too much alcohol.  Having long-term (chronic) kidney disease.  Having a family history of high blood pressure.  Age. Risk increases with age.  Race. You may be at higher risk if you are African American.  Gender. Men are at higher risk than women before age 22. After age 104, women are at higher risk than men.  Having obstructive sleep apnea.  Stress. What are the signs or symptoms?  High blood pressure may not cause symptoms. Very high blood pressure (hypertensive crisis) may cause: ? Headache. ? Feelings of worry  or nervousness (anxiety). ? Shortness of breath. ? Nosebleed. ? A feeling of being sick to your stomach (nausea). ? Throwing up (vomiting). ? Changes in how you see. ? Very bad chest pain. ? Seizures. How is this treated?  This condition is treated by making healthy lifestyle changes, such as: ? Eating healthy foods. ? Exercising more. ? Drinking less alcohol.  Your health care provider may prescribe medicine if lifestyle changes are not enough to get your blood pressure under control, and if: ? Your top number is above 130. ? Your bottom number is above 80.  Your personal target blood pressure may vary. Follow these instructions at home: Eating and drinking   If told, follow the DASH eating plan. To follow this plan: ? Fill one half of your plate at each meal with fruits and vegetables. ? Fill one fourth of your plate at each meal with whole grains.  Whole grains include whole-wheat pasta, brown rice, and whole-grain bread. ? Eat or drink low-fat dairy products, such as skim milk or low-fat yogurt. ? Fill one fourth of your plate at each meal with low-fat (lean) proteins. Low-fat proteins include fish, chicken without skin, eggs, beans, and tofu. ? Avoid fatty meat, cured and processed meat, or chicken with skin. ? Avoid pre-made or processed food.  Eat less than 1,500 mg of salt each day.  Do not drink alcohol if: ? Your doctor tells you not to drink. ? You are pregnant, may be pregnant, or are planning to become pregnant.  If you drink alcohol: ? Limit how much you use to:  0-1 drink a day for women.  0-2 drinks a day for men. ? Be aware of how much alcohol is in your drink. In the [place] one drink equals one 12 oz bottle of beer (355 mL), one 5 oz glass of wine (148 mL), or one 1 oz glass of hard liquor (44 mL). Lifestyle   Work with your doctor to stay at a healthy weight or to lose weight. Ask your doctor what the best weight is for you.  Get at least 30 minutes  of exercise most days of the week. This may include walking, swimming, or biking.  Get at least 30 minutes of exercise that strengthens your muscles (resistance exercise) at least 3 days a week. This may include lifting weights or doing Pilates.  Do not use any products that contain nicotine or tobacco, such as cigarettes, e-cigarettes, and chewing tobacco. If you need help quitting, ask your doctor.  Check your blood pressure at home as told by your doctor.  Keep all follow-up visits as told by your doctor. This is important. Medicines  Take over-the-counter and prescription medicines only as told by your doctor. Follow directions carefully.  Do not skip doses of blood pressure medicine. The medicine does not work as well if you skip doses. Skipping doses also puts you at risk for problems.  Ask your doctor about side effects or reactions to medicines that you should watch for. Contact a doctor if you:  Think you are having a reaction to the medicine you are taking.  Have headaches that keep coming back (recurring).  Feel dizzy.  Have swelling in your ankles.  Have trouble with your vision. Get help right away if you:  Get a very bad headache.  Start to feel mixed up (confused).  Feel weak or numb.  Feel faint.  Have very bad pain in your: ? Chest. ? Belly (abdomen).  Throw up more than once.  Have trouble breathing. Summary  Hypertension is another name for high blood pressure.  High blood pressure forces your heart to work harder to pump blood.  For most people, a normal blood pressure is less than 120/80.  Making healthy choices can help lower blood pressure. If your blood pressure does not get lower with healthy choices, you may need to take medicine. This information is not intended to replace advice given to you by your health care provider. Make sure you discuss any questions you have with your health care provider. Document Revised: 05/14/2018 Document  Reviewed: 05/14/2018 Elsevier Patient Education  2020 [employer].

## 2020-07-08 NOTE — Progress Notes (Signed)
Office Visit    Patient Name: Sandra Brandt Date of Encounter: 07/08/2020  Primary Care Provider:  Patient, No Pcp Per Primary Cardiologist:  Debbe Odea, MD Electrophysiologist:  None   Chief Complaint    Sandra Brandt is a 54 y.o. female with a hx of GERD, HTN, HLD, aortic atherosclerosis  presents today for hypertension  Past Medical History    Past Medical History:  Diagnosis Date  . GERD (gastroesophageal reflux disease)    History reviewed. No pertinent surgical history.  Allergies  Allergies  Allergen Reactions  . Sulfur Dermatitis    History of Present Illness    Sandra Brandt is a 54 y.o. female with a hx of GERD, HTN, HLD, aortic atherosclerosis last seen 10/13/2019 by Dr. Azucena Cecil.  Seen in consult by Dr. Azucena Cecil 10/13/19 due to dyspnea on exertion concerning for angina.  She was also noted to have grade 2 out of 6 systolic murmur.  She was recommended for echocardiogram and cardiac CTA.  Echo 11/04/2019 LVEF 65-70%, no RWMA, normal diastolic parameters, RV normal size and function, no significant valvular abnormalities. 12/03/2019 cardiac CTA with calcium score of 0.  It did show aortic atherosclerosis. She missed her follow-up appointment in April with our office.  06/24/20 seen by podiatry related to her plantar fasciitis. She was started on Medrol dose pack as well as Meloxicam.  [facility] regarding sinus and ear irritation.  She works for housekeeping at [employer].  She does not have a primary care provider.  For ongoing nasal congestion she was recommended to start Flonase, transition to Zyrtec.  She did have lab work collected which showed K5.0, creatinine 0.93, AST 13, ALT 15, GFR 81, Hb 12.4.  Lipid panel with total cholesterol 189, HDL 56, triglycerides 106, LDL 114.  Her BP at that visit was 191/102  Presents today for evaluation of hypertension.  She reports plantar fasciitis flare presently which is  very painful. Works night shift walking on concrete floors. Reports no chest pain, pressure, tightness.  Reports no shortness of breath at rest nor dyspnea on exertion.  Tells me she had a headache the day before yesterday. Felt better when she laid down.  She does not have a blood pressure cuff at home but is agreeable to purchase.  Her BP in clinic 10/13/2019 was 130/90.  Previous BP in multiple ED visit routinely greater than 150/90.  She is a strong family history of hypertension in her mother as well as both of her siblings.  We discussed the effect her blood pressure can have on other organ systems including renal function.   EKGs/Labs/Other Studies Reviewed:   The following studies were reviewed today:  Echo 11/04/2019 1. Left ventricular ejection fraction, by estimation, is 65 to 70%. The  left ventricle has normal function. The left ventricle has no regional  wall motion abnormalities. Left ventricular diastolic parameters were  normal.   2. Right ventricular systolic function is normal. The right ventricular  size is normal.   3. The mitral valve is normal in structure and function. No evidence of  mitral valve regurgitation. No evidence of mitral stenosis.   4. The aortic valve is normal in structure and function. Aortic valve  regurgitation is not visualized. No aortic stenosis is present.   5. The inferior vena cava is normal in size with greater than 50%  respiratory variability, suggesting right atrial pressure of 3 mmHg.   Cardiac CTA 12/03/2019 IMPRESSION: 1.  Coronary calcium score of 0. Patient is low risk for near term coronary events   2. Normal coronary origin with likely left dominance.   3. No evidence of CAD although limited by artifacts.   4. Noise/body habitus, motion artifact prevents complete assessment of mid to distal coronary arteries  IMPRESSION:   1.  No acute findings in the imaged extracardiac chest. 2.  Aortic Atherosclerosis (ICD10-I70.0). 3.  Hepatic steatosis.   EKG:  EKG is  ordered today.  The ekg ordered today demonstrates NSR 72 bpm with no acute ST/T wave changes.   Recent Labs: 09/20/2019: TSH 1.862 07/07/2020: ALT 15; BUN 18; Creatinine, Ser 0.93; Hemoglobin 12.4; Platelets 257; Potassium 5.0; Sodium 141  Recent Lipid Panel    Component Value Date/Time   CHOL 189 07/07/2020 1123   TRIG 106 07/07/2020 1123   HDL 56 07/07/2020 1123   LDLCALC 114 (H) 07/07/2020 1123   Home Medications   Current Meds  Medication Sig  . fluticasone (FLONASE) 50 MCG/ACT nasal spray Place 2 sprays into both nostrils daily.  . meloxicam (MOBIC) 15 MG tablet Take 1 tablet (15 mg total) by mouth daily.    Review of Systems   All other systems reviewed and are otherwise negative except as noted above.  Physical Exam    VS:  BP (!) 190/100 (BP Location: Left Arm, Patient Position: Sitting, Cuff Size: Large)   Pulse 72   Ht 5\' 8"  (1.727 m)   Wt 258 lb (117 kg)   SpO2 98%   BMI 39.23 kg/m  , BMI Body mass index is 39.23 kg/m.  Wt Readings from Last 3 Encounters:  07/08/20 258 lb (117 kg)  07/07/20 259 lb (117.5 kg)  10/13/19 258 lb 12 oz (117.4 kg)    GEN: Well nourished, overweight, well developed, in no acute distress. HEENT: normal. Neck: Supple, no JVD, carotid bruits, or masses. Cardiac: RRR, no rubs,murmur,or gallops. No clubbing, cyanosis, edema.  Radials/DP/PT 2+ and equal bilaterally.  Respiratory:  Respirations regular and unlabored, clear to auscultation bilaterally. GI: Soft, nontender, nondistended. MS: No deformity or atrophy. Skin: Warm and dry, no rash. Neuro:  Strength and sensation are intact. Psych: Normal affect.  Assessment & Plan    1. HTN -BP markedly elevated today in clinic.  Initial BP 200/110, repeat 190/100 on her intervention.  Given 0.2 mg of clonidine in clinic.  On review of clinic visit 09/2019 with BP 130/90, visit yesterday with occupational health 191/102. Previous ED visits over the last  two year with BP routinely >150/90. Strong family history of hypertension in her mother and siblings. START Losartan-HCTZ 50-12.5mg  daily. Plan for follow up in 2 weeks with BMP at that visit. Renal function 07/07/20 normal. Goal of BP <130/80 though will likely have to take multiple steps to reach that goal. Recommend low salt, heart healthy diet. She was agreeable to purchase home BP cuff for monitoring and encouraged to bring BP cuff as well as log to her next visit.  2. HLD -07/07/2020 total cholesterol 189, HDL 56, triglycerides 106, LDL 114.  Recommend lipid-lowering diet and regular cardiovascular exercise to lower LDL to goal of less than 100.  No indication for cholesterol medication at this time.  3. Aortic atherosclerosis - Noted by CT 11/2019. Cardiac CTA 11/2019 with calcium score of 0.  Recommend optimal BP control as well as lipid-lowering diet.  4. Plantar fasciitis -seen by podiatry 06/24/2020 and started on meloxicam as well as Medrol Dosepak.  She is still  taking the meloxicam.  We discussed that this may be contributory to elevated blood pressure.  Disposition: Follow up in 2 week(s) with Dr. Azucena Cecil or APP with BMP at that time.  Signed, Alver Sorrow, NP 07/08/2020, 10:01 AM [facility]

## 2020-07-11 ENCOUNTER — Encounter: Payer: Self-pay | Admitting: Medical

## 2020-07-22 ENCOUNTER — Encounter: Payer: Self-pay | Admitting: Podiatry

## 2020-07-22 ENCOUNTER — Encounter: Payer: Self-pay | Admitting: *Deleted

## 2020-07-22 ENCOUNTER — Other Ambulatory Visit: Payer: Self-pay

## 2020-07-22 ENCOUNTER — Ambulatory Visit (INDEPENDENT_AMBULATORY_CARE_PROVIDER_SITE_OTHER): Payer: BC Managed Care – PPO | Admitting: Podiatry

## 2020-07-22 DIAGNOSIS — M722 Plantar fascial fibromatosis: Secondary | ICD-10-CM

## 2020-07-22 NOTE — Progress Notes (Signed)
   Subjective: 54 y.o. female presenting for follow-up evaluation of plantar fasciitis bilateral for approximately 3 months now.  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  Patient states that the prednisone pack and injections only provided temporary relief.  She went and got some new basic shoes that do not help.  She is also tried OTC power step insoles that were provided last visit with minimal relief.  She presents for further treatment evaluation  Past Medical History:  Diagnosis Date  . GERD (gastroesophageal reflux disease)      Objective: Physical Exam General: The patient is alert and oriented x3 in no acute distress.  Dermatology: Skin is warm, dry and supple bilateral lower extremities. Negative for open lesions or macerations bilateral.  Small corn lesion also noted to the left second toe medial aspect that is irritating with touch  Vascular: Dorsalis Pedis and Posterior Tibial pulses palpable bilateral.  Capillary fill time is immediate to all digits.  Neurological: Epicritic and protective threshold intact bilateral.   Musculoskeletal: Tenderness to palpation to the plantar aspect of the bilateral heels more posterior today. All other joints range of motion within normal limits bilateral. Strength 5/5 in all groups bilateral.   Radiographic exam: Normal osseous mineralization. Joint spaces preserved. No fracture/dislocation/boney destruction. No other soft tissue abnormalities or radiopaque foreign bodies.   Assessment: 1. plantar fasciitis bilateral feet 2.  Corn second digit left  Plan of Care:  1. Patient evaluated. Xrays reviewed.   2. Injection of 0.5cc Celestone soluspan injected into the bilateral heels slightly more posterior at the insertion of the plantar fascia.  3.  Silicone heel sleeves were provided for the patient.  Wear daily 4.  Continue meloxicam 15 mg daily 5.  Discontinue OTC power step insoles since  the patient states that they aggravated her heel pain more 6. Instructed patient regarding therapies and modalities at home to alleviate symptoms.  7.  Work note was provided for the patient today.  No work x2 weeks  8.  Return to clinic in 2 weeks  *Works housekeeping at [employer]  [name], DPM [facility]  Dr. [name], DPM    [address] [place], [place] [address]                Office [phone]  Fax [phone]

## 2020-07-24 NOTE — Progress Notes (Signed)
Office Visit    Patient Name: Sandra Brandt Date of Encounter: 07/25/2020  Primary Care Provider:  Patient, No Pcp Per Primary Cardiologist:  Debbe Odea, MD Electrophysiologist:  None   Chief Complaint    Sandra Brandt is a 54 y.o. female with a hx of GERD, HTN, HLD, aortic atherosclerosis  presents today for follow up after initiation of antihypertensive regimen.  Past Medical History    Past Medical History:  Diagnosis Date  . GERD (gastroesophageal reflux disease)    History reviewed. No pertinent surgical history.  Allergies  Allergies  Allergen Reactions  . Sulfur Dermatitis    History of Present Illness    Sandra Brandt is a 54 y.o. female with a hx of GERD, HTN, HLD, aortic atherosclerosis last seen 07/08/20.  Seen in consult by Dr. Azucena Cecil 10/13/19 due to dyspnea on exertion concerning for angina.  She was also noted to have grade 2 out of 6 systolic murmur.  She was recommended for echocardiogram and cardiac CTA.  Echo 11/04/2019 LVEF 65-70%, no RWMA, normal diastolic parameters, RV normal size and function, no significant valvular abnormalities. 12/03/2019 cardiac CTA with calcium score of 0.  It did show aortic atherosclerosis. She missed her follow-up appointment in April with our office.  06/24/20 seen by podiatry related to her plantar fasciitis. She was started on Medrol dose pack as well as Meloxicam.  [facility] by [facility] 07/07/20 regarding sinus and ear irritation.  She works for housekeeping at [employer].  She does not have a primary care provider.  For ongoing nasal congestion she was recommended to start Flonase, transition to Zyrtec.  She did have lab work collected which showed K5.0, creatinine 0.93, AST 13, ALT 15, GFR 81, Hb 12.4.  Lipid panel with total cholesterol 189, HDL 56, triglycerides 106, LDL 114.  Her BP at that visit was 191/102  See in clinic 07/08/20. Noted pain due to her plantar fascitis, headache. BP in  clinic markedly elevated and Losartan-HCTZ 50-12.5 was started.   She saw podiatry last week and they have put her out of work for 2 weeks due to plantar fasciitis and also gave her injections in bilateral heels.  Her sister purchased a blood pressure cuff for her and we practiced using it today in the office.  She reports no chest pain, pressure, tightness.  Reports no shortness of breath at rest nor dyspnea on exertion.  Tells me her headaches have resolved.  EKGs/Labs/Other Studies Reviewed:   The following studies were reviewed today:  Echo 11/04/2019 1. Left ventricular ejection fraction, by estimation, is 65 to 70%. The  left ventricle has normal function. The left ventricle has no regional  wall motion abnormalities. Left ventricular diastolic parameters were  normal.   2. Right ventricular systolic function is normal. The right ventricular  size is normal.   3. The mitral valve is normal in structure and function. No evidence of  mitral valve regurgitation. No evidence of mitral stenosis.   4. The aortic valve is normal in structure and function. Aortic valve  regurgitation is not visualized. No aortic stenosis is present.   5. The inferior vena cava is normal in size with greater than 50%  respiratory variability, suggesting right atrial pressure of 3 mmHg.   Cardiac CTA 12/03/2019 IMPRESSION: 1. Coronary calcium score of 0. Patient is low risk for near term coronary events   2. Normal coronary origin with likely left dominance.   3. No evidence of  CAD although limited by artifacts.   4. Noise/body habitus, motion artifact prevents complete assessment of mid to distal coronary arteries  IMPRESSION:   1.  No acute findings in the imaged extracardiac chest. 2.  Aortic Atherosclerosis (ICD10-I70.0). 3. Hepatic steatosis.   EKG: No EKG EKG is  ordered today.  The ekg independently reviewed from 07/07/2020 demonstrated NSR 72 bpm with no acute ST/T wave changes.   Recent  Labs: 09/20/2019: TSH 1.862 07/07/2020: ALT 15; BUN 18; Creatinine, Ser 0.93; Hemoglobin 12.4; Platelets 257; Potassium 5.0; Sodium 141  Recent Lipid Panel    Component Value Date/Time   CHOL 189 07/07/2020 1123   TRIG 106 07/07/2020 1123   HDL 56 07/07/2020 1123   LDLCALC 114 (H) 07/07/2020 1123   Home Medications   Current Meds  Medication Sig  . fluticasone (FLONASE) 50 MCG/ACT nasal spray Place 2 sprays into both nostrils daily.  Marland Kitchen losartan-hydrochlorothiazide (HYZAAR) 50-12.5 MG tablet Take 1 tablet by mouth daily.  . meloxicam (MOBIC) 15 MG tablet Take 1 tablet (15 mg total) by mouth daily.  . [DISCONTINUED] losartan-hydrochlorothiazide (HYZAAR) 50-12.5 MG tablet Take 1 tablet by mouth daily.    Review of Systems   All other systems reviewed and are otherwise negative except as noted above.  Physical Exam    VS:  BP 138/90 (BP Location: Left Arm, Patient Position: Sitting, Cuff Size: Large)   Pulse 85   Ht 5\' 8"  (1.727 m)   Wt 254 lb (115.2 kg)   SpO2 96%   BMI 38.62 kg/m  , BMI Body mass index is 38.62 kg/m.  Wt Readings from Last 3 Encounters:  07/25/20 254 lb (115.2 kg)  07/08/20 258 lb (117 kg)  07/07/20 259 lb (117.5 kg)    GEN: Well nourished, overweight, well developed, in no acute distress. HEENT: normal. Neck: Supple, no JVD, carotid bruits, or masses. Cardiac: RRR, no rubs,murmur,or gallops. No clubbing, cyanosis, edema.  Radials/DP/PT 2+ and equal bilaterally.  Respiratory:  Respirations regular and unlabored, clear to auscultation bilaterally. GI: Soft, nontender, nondistended. MS: No deformity or atrophy. Skin: Warm and dry, no rash. Neuro:  Strength and sensation are intact. Psych: Normal affect.  Assessment & Plan    1. HTN -BP much improved after addition of losartan-HCTZ 50-12.5 mg daily.   BMP today. Goal of BP <130/80 though will likely have to take multiple steps to reach that goal. Recommend low salt, heart healthy diet.  She will continue  to monitor at home and report BP consistently above goal.  Discussed low-sodium diet.  2. HLD -07/07/2020 total cholesterol 189, HDL 56, triglycerides 106, LDL 114.  Recommend lipid-lowering diet and regular cardiovascular exercise to lower LDL to goal of less than 100.  No indication for cholesterol medication at this time.  3. Aortic atherosclerosis - Noted by CT 11/2019. Cardiac CTA 11/2019 with calcium score of 0.  Recommend optimal BP control as well as lipid-lowering diet.  Disposition: Follow up in 3-4 month(s) with Dr. 12/2019 or APP with BMP at that time.  Signed, Azucena Cecil, NP 07/25/2020, 10:08 AM [facility]

## 2020-07-25 ENCOUNTER — Encounter: Payer: Self-pay | Admitting: Family

## 2020-07-25 ENCOUNTER — Other Ambulatory Visit: Payer: Self-pay

## 2020-07-25 ENCOUNTER — Ambulatory Visit (INDEPENDENT_AMBULATORY_CARE_PROVIDER_SITE_OTHER): Payer: BC Managed Care – PPO | Admitting: Family

## 2020-07-25 VITALS — BP 138/90 | HR 85 | Ht 68.0 in | Wt 254.0 lb

## 2020-07-25 DIAGNOSIS — I700 Atherosclerosis of aorta: Secondary | ICD-10-CM

## 2020-07-25 DIAGNOSIS — Z79899 Other long term (current) drug therapy: Secondary | ICD-10-CM | POA: Diagnosis not present

## 2020-07-25 DIAGNOSIS — I10 Essential (primary) hypertension: Secondary | ICD-10-CM

## 2020-07-25 DIAGNOSIS — E782 Mixed hyperlipidemia: Secondary | ICD-10-CM

## 2020-07-25 MED ORDER — LOSARTAN POTASSIUM-HCTZ 50-12.5 MG PO TABS: 1.0000 | ORAL_TABLET | Freq: Every day | ORAL | 2 refills | Status: DC

## 2020-07-25 NOTE — Patient Instructions (Addendum)
Medication Instructions:  Your physician recommends that you continue on your current medications as directed. Please refer to the Current Medication list given to you today.  An Rx was sent to your pharmacy for Losartan/HCTZ.  *If you need a refill on your cardiac medications before your next appointment, please call your pharmacy*   Lab Work: Your physician recommends that you return for lab work in: TODAY - Bmet  If you have labs (blood work) drawn today and your tests are completely normal, you will receive your results only by: Marland Kitchen MyChart Message (if you have MyChart) OR . A paper copy in the mail If you have any lab test that is abnormal or we need to change your treatment, we will call you to review the results.   Testing/Procedures: None ordered.   Follow-Up: At [facility], you and your health needs are our priority.  As part of our continuing mission to provide you with exceptional heart care, we have created designated Provider Care Teams.  These Care Teams include your primary Cardiologist (physician) and Advanced Practice Providers (APPs -  Physician Assistants and Nurse Practitioners) who all work together to provide you with the care you need, when you need it.  We recommend signing up for the patient portal called "MyChart".  Sign up information is provided on this After Visit Summary.  MyChart is used to connect with patients for Virtual Visits (Telemedicine).  Patients are able to view lab/test results, encounter notes, upcoming appointments, etc.  Non-urgent messages can be sent to your provider as well.   To learn more about what you can do with MyChart, go to [email or web address].    Your next appointment:   3-4 month(s)  The format for your next appointment:   In Person  Provider:   You may see Debbe Odea, MD or one of the following Advanced Practice Providers on your designated Care Team:    Nicolasa Ducking, NP  Eula Listen, PA-C  Marisue Ivan, PA-C  Cadence Fransico Michael, [place]      Check your blood pressure at least 3 times per week and keep a log. If your blood pressure is consistently >140/90 please call our office.   You are fine to take over the counter allergy medicine such as Loratidine (Claritin) or Cetirizine (Zyrtec). Just avoid any medications with decongestant such as 'Claritin-D' as this can elevate your blood pressure.   Per Ellie Lunch, PA a referral is not needed to see ENT. Their information is below.   Address: [address], [place], [place] [address]  Phone: [phone]  Appointments: [email or web address].

## 2020-07-26 ENCOUNTER — Telehealth: Payer: Self-pay

## 2020-07-26 DIAGNOSIS — I10 Essential (primary) hypertension: Secondary | ICD-10-CM

## 2020-07-26 LAB — BASIC METABOLIC PANEL
BUN/Creatinine Ratio: 21 (ref 9–23)
BUN: 28 mg/dL — ABNORMAL HIGH (ref 6–24)
CO2: 24 mmol/L (ref 20–29)
Calcium: 9.5 mg/dL (ref 8.7–10.2)
Chloride: 102 mmol/L (ref 96–106)
Creatinine, Ser: 1.34 mg/dL — ABNORMAL HIGH (ref 0.57–1.00)
GFR calc Af Amer: 52 mL/min/{1.73_m2} — ABNORMAL LOW (ref >59)
GFR calc non Af Amer: 45 mL/min/{1.73_m2} — ABNORMAL LOW (ref >59)
Glucose: 85 mg/dL (ref 65–99)
Potassium: 4.5 mmol/L (ref 3.5–5.2)
Sodium: 140 mmol/L (ref 134–144)

## 2020-07-26 MED ORDER — LOSARTAN POTASSIUM-HCTZ 50-12.5 MG PO TABS: 0.5000 | ORAL_TABLET | Freq: Every day | ORAL | Status: DC

## 2020-07-26 NOTE — Telephone Encounter (Signed)
Patient made aware of lab results and Gillian Shields, NP recommendation. Patient sts that she reviewed the lab results and Caitlin instructions on mychart. She will cut her Losartan HCTZ in a/2 and take a 1/2 tab daily. Lab order placed for 2 wk repeat [facility]  Patient sts that she thinks she may have a UTI and has been experiencing dysuria. She does not have a pcp. Recommended that she be seen at an urgent care for evaluation. Patient verbalized understanding.

## 2020-07-26 NOTE — Telephone Encounter (Signed)
-----   Message from Alver Sorrow, NP sent at 07/26/2020  7:46 AM EST ----- Normal electrolytes. Kidney function has declined. Recommend she increase her intake of water. Recommend splitting her Losartan-HCTZ in half for 0.5 tablet once daily. Repeat BMP in 2 weeks at [facility].

## 2020-08-04 DIAGNOSIS — M79676 Pain in unspecified toe(s): Secondary | ICD-10-CM

## 2020-08-05 ENCOUNTER — Other Ambulatory Visit: Payer: Self-pay

## 2020-08-05 ENCOUNTER — Encounter: Payer: Self-pay | Admitting: *Deleted

## 2020-08-05 ENCOUNTER — Ambulatory Visit (INDEPENDENT_AMBULATORY_CARE_PROVIDER_SITE_OTHER): Payer: BC Managed Care – PPO | Admitting: Podiatry

## 2020-08-05 ENCOUNTER — Encounter: Payer: Self-pay | Admitting: Podiatry

## 2020-08-05 DIAGNOSIS — M722 Plantar fascial fibromatosis: Secondary | ICD-10-CM

## 2020-08-05 DIAGNOSIS — M21621 Bunionette of right foot: Secondary | ICD-10-CM

## 2020-08-05 NOTE — Progress Notes (Signed)
   Subjective: 54 y.o. female presenting for follow-up evaluation of plantar fasciitis bilateral for approximately 3 months now.  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  Patient also states that she has some pain under the fifth toe of the right foot.  It is very symptomatic and causing pain with ambulation.  She does have history of bunionectomy surgery and tailor's bunionectomy surgery to the bilateral feet.  She presents for further treatment evaluation  Past Medical History:  Diagnosis Date  . GERD (gastroesophageal reflux disease)      Objective: Physical Exam General: The patient is alert and oriented x3 in no acute distress.  Dermatology: Skin is warm, dry and supple bilateral lower extremities. Negative for open lesions or macerations bilateral.  Small corn lesion also noted to the left second toe medial aspect that is irritating with touch  Vascular: Dorsalis Pedis and Posterior Tibial pulses palpable bilateral.  Capillary fill time is immediate to all digits.  Neurological: Epicritic and protective threshold intact bilateral.   Musculoskeletal: Tenderness to palpation to the plantar aspect of the right heel.  Patient states that the left heel is actually doing much better.. All other joints range of motion within normal limits bilateral. Strength 5/5 in all groups bilateral.  Clinical evidence of a tailor's bunion deformity with a hypertrophic eminence of the fifth metatarsal head noted.   Assessment: 1. plantar fasciitis bilateral feet.  Left seems to be resolved today. 2.  Corn second digit left-asymptomatic 3.  Tailor's bunion deformity right 4.  History of bunionectomy and tailor's bunion surgery bilateral   Plan of Care:  1. Patient evaluated. Xrays reviewed.   2. Injection of 0.5cc Celestone soluspan injected into the right plantar fascia  3.  Continue silicone heel sleeves.  Wear daily 4.  Continue meloxicam 15 mg  daily 5.  Discontinue OTC power step insoles since the patient states that they aggravated her heel pain more 6.  Plantar fascial brace dispensed today.  Wear daily right foot  7.  Patient may return to work 08/15/2020 full activity no restrictions however, rest as needed 8.  Return to clinic in 4 weeks.  If the patient is not better or improved in symptoms we may need to proceed with surgical intervention at this time.  Surgery would consist of EPF fasciotomy right and revisional tailor's bunionectomy right  *Works housekeeping at [employer]  [name], DPM [facility]  Dr. [name], DPM    [address] [place], [place] [address]                Office [phone]  Fax [phone]

## 2020-08-17 DIAGNOSIS — A599 Trichomoniasis, unspecified: Secondary | ICD-10-CM

## 2020-08-17 HISTORY — DX: Trichomoniasis, unspecified: A59.9

## 2020-08-18 ENCOUNTER — Other Ambulatory Visit: Payer: Self-pay

## 2020-08-18 ENCOUNTER — Ambulatory Visit: Payer: BC Managed Care – PPO | Admitting: Obstetrics and Gynecology

## 2020-08-18 ENCOUNTER — Encounter: Payer: Self-pay | Admitting: Obstetrics and Gynecology

## 2020-08-18 ENCOUNTER — Other Ambulatory Visit (HOSPITAL_COMMUNITY)
Admission: RE | Admit: 2020-08-18 | Discharge: 2020-08-18 | Disposition: A | Discharge status: Home / Self Care | Payer: BC Managed Care – PPO | Source: Ambulatory Visit | Attending: Obstetrics and Gynecology | Admitting: Obstetrics and Gynecology

## 2020-08-18 VITALS — BP 145/83 | HR 87 | Ht 68.0 in | Wt 257.1 lb

## 2020-08-18 DIAGNOSIS — M722 Plantar fascial fibromatosis: Secondary | ICD-10-CM | POA: Insufficient documentation

## 2020-08-18 DIAGNOSIS — Z0000 Encounter for general adult medical examination without abnormal findings: Secondary | ICD-10-CM

## 2020-08-18 DIAGNOSIS — I10 Essential (primary) hypertension: Secondary | ICD-10-CM

## 2020-08-18 DIAGNOSIS — Z124 Encounter for screening for malignant neoplasm of cervix: Secondary | ICD-10-CM

## 2020-08-18 DIAGNOSIS — N898 Other specified noninflammatory disorders of vagina: Secondary | ICD-10-CM

## 2020-08-18 DIAGNOSIS — R87610 Atypical squamous cells of undetermined significance on cytologic smear of cervix (ASC-US): Secondary | ICD-10-CM | POA: Insufficient documentation

## 2020-08-18 DIAGNOSIS — Z01419 Encounter for gynecological examination (general) (routine) without abnormal findings: Secondary | ICD-10-CM | POA: Diagnosis not present

## 2020-08-18 DIAGNOSIS — A599 Trichomoniasis, unspecified: Secondary | ICD-10-CM

## 2020-08-18 DIAGNOSIS — A5901 Trichomonal vulvovaginitis: Secondary | ICD-10-CM | POA: Insufficient documentation

## 2020-08-18 DIAGNOSIS — Z1231 Encounter for screening mammogram for malignant neoplasm of breast: Secondary | ICD-10-CM

## 2020-08-18 DIAGNOSIS — Z1211 Encounter for screening for malignant neoplasm of colon: Secondary | ICD-10-CM

## 2020-08-18 HISTORY — DX: Plantar fascial fibromatosis: M72.2

## 2020-08-18 NOTE — Progress Notes (Signed)
Pt present to est care. Pt stated that she was doing well no problems. Pt had covid vaccine unsure of dates. Pt unsure of last pap smear and mammogram and never had a colonoscopy. Pt stated that she will get flu vaccine at her job.

## 2020-08-18 NOTE — Progress Notes (Signed)
ANNUAL PREVENTATIVE CARE GYNECOLOGY  ENCOUNTER NOTE  Subjective:       Sandra Brandt is a 54 y.o. G80P1102 female here to establish care and for a routine annual gynecologic exam. Notes she has not seen a gynecologist in at least 10 years. The patient is not currently sexually active. The patient has never been taking hormone replacement therapy. Patient denies post-menopausal vaginal bleeding. The patient wears seatbelts: yes. The patient participates in regular exercise: no. Has the patient ever been transfused or tattooed?: yes (professional tattoos). The patient reports that there is not domestic violence in her life.  Current complaints: 1.  Reports having some vaginal discharge and odor for several months (since September).    Gynecologic History No LMP recorded. Patient is postmenopausal. Contraception: post menopausal status Last Pap: cannot recall. Results were: normal Last mammogram: never had one.  Last Colonoscopy: never had one.    Obstetric History OB History  Gravida Para Term Preterm AB Living  2 2 1 1   2   SAB TAB Ectopic Multiple Live Births          2    # Outcome Date GA Lbr Len/2nd Weight Sex Delivery Anes PTL Lv  2 Term 05/30/06   7 lb (3.175 kg) M Vag-Spont   LIV  1 Preterm 12/25/89   5 lb (2.268 kg) M Vag-Spont   LIV    Past Medical History:  Diagnosis Date  . GERD (gastroesophageal reflux disease)   . Hypertension   . Plantar fasciitis of right foot 08/18/2020  . Seasonal allergies     Family History  Problem Relation Age of Onset  . Hypertension Mother   . Hyperlipidemia Mother   . Heart disease Mother 31       CABG & stent   . Cancer Mother   . Hyperlipidemia Sister   . Hypertension Sister   . Hypertension Brother   . Diabetes Brother     Past Surgical History:  Procedure Laterality Date  . BUNIONECTOMY    . FOOT SURGERY      Social History   Socioeconomic History  . Marital status: Single    Spouse name: Not on file  .  Number of children: Not on file  . Years of education: Not on file  . Highest education level: Not on file  Occupational History  . Not on file  Tobacco Use  . Smoking status: Former Smoker    Packs/day: 6.00    Types: Cigarettes    Quit date: 12/05/2018    Years since quitting: 1.7  . Smokeless tobacco: Never Used  . Tobacco comment: social smoker   Vaping Use  . Vaping Use: Never used  Substance and Sexual Activity  . Alcohol use: Yes  . Drug use: Never  . Sexual activity: Not Currently  Other Topics Concern  . Not on file  Social History Narrative  . Not on file   Social Determinants of Health   Financial Resource Strain:   . Difficulty of Paying Living Expenses: Not on file  Food Insecurity:   . Worried About 12/07/2018 in the Last Year: Not on file  . Ran Out of Food in the Last Year: Not on file  Transportation Needs:   . Lack of Transportation (Medical): Not on file  . Lack of Transportation (Non-Medical): Not on file  Physical Activity:   . Days of Exercise per Week: Not on file  . Minutes of Exercise per Session: Not  on file  Stress:   . Feeling of Stress : Not on file  Social Connections:   . Frequency of Communication with Friends and Family: Not on file  . Frequency of Social Gatherings with Friends and Family: Not on file  . Attends Religious Services: Not on file  . Active Member of Clubs or Organizations: Not on file  . Attends Banker Meetings: Not on file  . Marital Status: Not on file  Intimate Partner Violence:   . Fear of Current or Ex-Partner: Not on file  . Emotionally Abused: Not on file  . Physically Abused: Not on file  . Sexually Abused: Not on file    Current Outpatient Medications on File Prior to Visit  Medication Sig Dispense Refill  . fluticasone (FLONASE) 50 MCG/ACT nasal spray Place 2 sprays into both nostrils daily. 16 g 6  . losartan-hydrochlorothiazide (HYZAAR) 50-12.5 MG tablet Take 0.5 tablets by mouth  daily.    . meloxicam (MOBIC) 15 MG tablet Take 1 tablet (15 mg total) by mouth daily. 30 tablet 1  . Misc Natural Products (AIRBORNE ELDERBERRY) CHEW Chew by mouth.     No current facility-administered medications on file prior to visit.    Allergies  Allergen Reactions  . Sulfur Dermatitis      Review of Systems ROS Review of Systems - General ROS: negative for - chills, fatigue, fever, hot flashes, night sweats, weight gain or weight loss Psychological ROS: negative for - anxiety, decreased libido, depression, mood swings, physical abuse or sexual abuse Ophthalmic ROS: negative for - blurry vision, eye pain or loss of vision ENT ROS: negative for - headaches, hearing change, visual changes or vocal changes Allergy and Immunology ROS: negative for - hives, itchy/watery eyes or seasonal allergies Hematological and Lymphatic ROS: negative for - bleeding problems, bruising, swollen lymph nodes or weight loss Endocrine ROS: negative for - galactorrhea, hair pattern changes, hot flashes, malaise/lethargy, mood swings, palpitations, polydipsia/polyuria, skin changes, temperature intolerance or unexpected weight changes Breast ROS: negative for - new or changing breast lumps or nipple discharge Respiratory ROS: negative for - cough or shortness of breath Cardiovascular ROS: negative for - chest pain, irregular heartbeat, palpitations or shortness of breath Gastrointestinal ROS: no abdominal pain, change in bowel habits, or black or bloody stools Genito-Urinary ROS: no dysuria, trouble voiding, or hematuria. Positive for vaginal discharge.  Musculoskeletal ROS: negative for - joint pain or joint stiffness Neurological ROS: negative for - bowel and bladder control changes Dermatological ROS: negative for rash and skin lesion changes   Objective:   BP (!) 145/83   Pulse 87   Ht 5\' 8"  (1.727 m)   Wt 257 lb 1.6 oz (116.6 kg)   BMI 39.09 kg/m  CONSTITUTIONAL: Well-developed,  well-nourished female in no acute distress. Moderate obesity PSYCHIATRIC: Normal mood and affect. Normal behavior. Normal judgment and thought content. NEUROLGIC: Alert and oriented to person, place, and time. Normal muscle tone coordination. No cranial nerve deficit noted. HENT:  Normocephalic, atraumatic, External right and left ear normal. Oropharynx is clear and moist EYES: Conjunctivae and EOM are normal. Pupils are equal, round, and reactive to light. No scleral icterus.  NECK: Normal range of motion, supple, no masses.  Normal thyroid.  SKIN: Skin is warm and dry. No rash noted. Not diaphoretic. No erythema. No pallor. CARDIOVASCULAR: Normal heart rate noted, regular rhythm, no murmur. RESPIRATORY: Clear to auscultation bilaterally. Effort and breath sounds normal, no problems with respiration noted. BREASTS: Symmetric in size.  No masses, skin changes, nipple drainage, or lymphadenopathy. ABDOMEN: Soft, normal bowel sounds, no distention noted.  No tenderness, rebound or guarding.  BLADDER: Normal PELVIC:  Bladder no bladder distension noted  Urethra: normal appearing urethra with no masses, tenderness or lesions  Vulva: normal appearing vulva with no masses, tenderness or lesions  Vagina: normal appearing vagina with normal color, no lesions.  Moderate yellow-white discharge in vault, no odor.   Cervix: normal appearing cervix without discharge or lesions  Uterus: uterus is normal size, shape, consistency and nontender  Adnexa: normal adnexa in size, nontender and no masses  RV: External Exam NormaI, No Rectal Masses and Normal Sphincter tone  MUSCULOSKELETAL: Normal range of motion. No tenderness.  No cyanosis, clubbing, or edema.  2+ distal pulses. LYMPHATIC: No Axillary, Supraclavicular, or Inguinal Adenopathy.   Labs: Lab Results  Component Value Date   WBC 8.1 07/07/2020   HGB 12.4 07/07/2020   HCT 36.9 07/07/2020   MCV 93 07/07/2020   PLT 257 07/07/2020    Lab Results   Component Value Date   CREATININE 1.34 (H) 07/25/2020   BUN 28 (H) 07/25/2020   NA 140 07/25/2020   K 4.5 07/25/2020   CL 102 07/25/2020   CO2 24 07/25/2020    Lab Results  Component Value Date   ALT 15 07/07/2020   AST 13 07/07/2020   ALKPHOS 106 07/07/2020   BILITOT <0.2 07/07/2020    Lab Results  Component Value Date   CHOL 189 07/07/2020   HDL 56 07/07/2020   LDLCALC 114 (H) 07/07/2020   TRIG 106 07/07/2020    Lab Results  Component Value Date   TSH 1.862 09/20/2019    No results found for: HGBA1C   Assessment:   1. Encounter for medical examination to establish care   2. Encounter for gynecological examination with Papanicolaou smear of cervix   3. Breast cancer screening by mammogram   4. Pap smear for cervical cancer screening   5. Vaginal discharge   6. Colon cancer screening   7. Essential hypertension     Plan:  - Pap: Pap Co Test performed today.  - Mammogram: Ordered - Stool Guaiac Testing:  Discussed option of colonoscopy vs colon screening with Cologard.  Advised on risks and benefits of each method. Patient is not at high risk for cancer (no personal or family history of cancer or polyps).  Patient ok to perform Cologuard.  Will order.  - Labs: Labs reviewed from October and November. Kidney function noted with elevated Creatinine.  - Routine preventative health maintenance measures emphasized: Exercise/Diet/Weight control, Tobacco Warnings, Alcohol/Substance use risks, Stress Management and Safe Sex.  - COVID Vaccination status: currently declines.  - Flu vaccine: plans to receive at work.  - Patient notes that she does not have a PCP. Sees specialty providers for her problems (Podiatry, Cardiology). Encouraged to see care with a PCP. Will refer.  - Vaginal discharge noted, Nuswab collected. Will notify patient of results by phone.  - Return to Clinic - 1 Year   Hildred Laser, MD [facility]

## 2020-08-18 NOTE — Patient Instructions (Signed)
Health Maintenance, Female Adopting a healthy lifestyle and getting preventive care are important in promoting health and wellness. Ask your health care provider about:  The right schedule for you to have regular tests and exams.  Things you can do on your own to prevent diseases and keep yourself healthy. What should I know about diet, weight, and exercise? Eat a healthy diet   Eat a diet that includes plenty of vegetables, fruits, low-fat dairy products, and lean protein.  Do not eat a lot of foods that are high in solid fats, added sugars, or sodium. Maintain a healthy weight Body mass index (BMI) is used to identify weight problems. It estimates body fat based on height and weight. Your health care provider can help determine your BMI and help you achieve or maintain a healthy weight. Get regular exercise Get regular exercise. This is one of the most important things you can do for your health. Most adults should:  Exercise for at least 150 minutes each week. The exercise should increase your heart rate and make you sweat (moderate-intensity exercise).  Do strengthening exercises at least twice a week. This is in addition to the moderate-intensity exercise.  Spend less time sitting. Even light physical activity can be beneficial. Watch cholesterol and blood lipids Have your blood tested for lipids and cholesterol at 53 years of age, then have this test every 5 years. Have your cholesterol levels checked more often if:  Your lipid or cholesterol levels are high.  You are older than 53 years of age.  You are at high risk for heart disease. What should I know about cancer screening? Depending on your health history and family history, you may need to have cancer screening at various ages. This may include screening for:  Breast cancer.  Cervical cancer.  Colorectal cancer.  Skin cancer.  Lung cancer. What should I know about heart disease, diabetes, and high blood  pressure? Blood pressure and heart disease  High blood pressure causes heart disease and increases the risk of stroke. This is more likely to develop in people who have high blood pressure readings, are of African descent, or are overweight.  Have your blood pressure checked: ? Every 3-5 years if you are 18-39 years of age. ? Every year if you are 40 years old or older. Diabetes Have regular diabetes screenings. This checks your fasting blood sugar level. Have the screening done:  Once every three years after age 40 if you are at a normal weight and have a low risk for diabetes.  More often and at a younger age if you are overweight or have a high risk for diabetes. What should I know about preventing infection? Hepatitis B If you have a higher risk for hepatitis B, you should be screened for this virus. Talk with your health care provider to find out if you are at risk for hepatitis B infection. Hepatitis C Testing is recommended for:  Everyone born from 1945 through 1965.  Anyone with known risk factors for hepatitis C. Sexually transmitted infections (STIs)  Get screened for STIs, including gonorrhea and chlamydia, if: ? You are sexually active and are younger than 53 years of age. ? You are older than 53 years of age and your health care provider tells you that you are at risk for this type of infection. ? Your sexual activity has changed since you were last screened, and you are at increased risk for chlamydia or gonorrhea. Ask your health care provider if   you are at risk.  Ask your health care provider about whether you are at high risk for HIV. Your health care provider may recommend a prescription medicine to help prevent HIV infection. If you choose to take medicine to prevent HIV, you should first get tested for HIV. You should then be tested every 3 months for as long as you are taking the medicine. Pregnancy  If you are about to stop having your period (premenopausal) and  you may become pregnant, seek counseling before you get pregnant.  Take 400 to 800 micrograms (mcg) of folic acid every day if you become pregnant.  Ask for birth control (contraception) if you want to prevent pregnancy. Osteoporosis and menopause Osteoporosis is a disease in which the bones lose minerals and strength with aging. This can result in bone fractures. If you are 65 years old or older, or if you are at risk for osteoporosis and fractures, ask your health care provider if you should:  Be screened for bone loss.  Take a calcium or vitamin D supplement to lower your risk of fractures.  Be given hormone replacement therapy (HRT) to treat symptoms of menopause. Follow these instructions at home: Lifestyle  Do not use any products that contain nicotine or tobacco, such as cigarettes, e-cigarettes, and chewing tobacco. If you need help quitting, ask your health care provider.  Do not use street drugs.  Do not share needles.  Ask your health care provider for help if you need support or information about quitting drugs. Alcohol use  Do not drink alcohol if: ? Your health care provider tells you not to drink. ? You are pregnant, may be pregnant, or are planning to become pregnant.  If you drink alcohol: ? Limit how much you use to 0-1 drink a day. ? Limit intake if you are breastfeeding.  Be aware of how much alcohol is in your drink. In the [place] one drink equals one 12 oz bottle of beer (355 mL), one 5 oz glass of wine (148 mL), or one 1 oz glass of hard liquor (44 mL). General instructions  Schedule regular health, dental, and eye exams.  Stay current with your vaccines.  Tell your health care provider if: ? You often feel depressed. ? You have ever been abused or do not feel safe at home. Summary  Adopting a healthy lifestyle and getting preventive care are important in promoting health and wellness.  Follow your health care provider's instructions about healthy  diet, exercising, and getting tested or screened for diseases.  Follow your health care provider's instructions on monitoring your cholesterol and blood pressure. This information is not intended to replace advice given to you by your health care provider. Make sure you discuss any questions you have with your health care provider. Document Revised: 08/27/2018 Document Reviewed: 08/27/2018 Elsevier Patient Education  2020 Elsevier Inc.    Breast Self-Awareness Breast self-awareness means being familiar with how your breasts look and feel. It involves checking your breasts regularly and reporting any changes to your health care provider. Practicing breast self-awareness is important. Sometimes changes may not be harmful (are benign), but sometimes a change in your breasts can be a sign of a serious medical problem. It is important to learn how to do this procedure correctly so that you can catch problems early, when treatment is more likely to be successful. All women should practice breast self-awareness, including women who have had breast implants. What you need:  A mirror.  A well-lit   room. How to do a breast self-exam A breast self-exam is one way to learn what is normal for your breasts and whether your breasts are changing. To do a breast self-exam: Look for changes  1. Remove all the clothing above your waist. 2. Stand in front of a mirror in a room with good lighting. 3. Put your hands on your hips. 4. Push your hands firmly downward. 5. Compare your breasts in the mirror. Look for differences between them (asymmetry), such as: ? Differences in shape. ? Differences in size. ? Puckers, dips, and bumps in one breast and not the other. 6. Look at each breast for changes in the skin, such as: ? Redness. ? Scaly areas. 7. Look for changes in your nipples, such as: ? Discharge. ? Bleeding. ? Dimpling. ? Redness. ? A change in position. Feel for changes Carefully feel your  breasts for lumps and changes. It is best to do this while lying on your back on the floor, and again while sitting or standing in the tub or shower with soapy water on your skin. Feel each breast in the following way: 1. Place the arm on the side of the breast you are examining above your head. 2. Feel your breast with the other hand. 3. Start in the nipple area and make -inch (2 cm) overlapping circles to feel your breast. Use the pads of your three middle fingers to do this. Apply light pressure, then medium pressure, then firm pressure. The light pressure will allow you to feel the tissue closest to the skin. The medium pressure will allow you to feel the tissue that is a little deeper. The firm pressure will allow you to feel the tissue close to the ribs. 4. Continue the overlapping circles, moving downward over the breast until you feel your ribs below your breast. 5. Move one finger-width toward the center of the body. Continue to use the -inch (2 cm) overlapping circles to feel your breast as you move slowly up toward your collarbone. 6. Continue the up-and-down exam using all three pressures until you reach your armpit.  Write down what you find Writing down what you find can help you remember what to discuss with your health care provider. Write down:  What is normal for each breast.  Any changes that you find in each breast, including: ? The kind of changes you find. ? Any pain or tenderness. ? Size and location of any lumps.  Where you are in your menstrual cycle, if you are still menstruating. General tips and recommendations  Examine your breasts every month.  If you are breastfeeding, the best time to examine your breasts is after a feeding or after using a breast pump.  If you menstruate, the best time to examine your breasts is 5-7 days after your period. Breasts are generally lumpier during menstrual periods, and it may be more difficult to notice changes.  With time  and practice, you will become more familiar with the variations in your breasts and more comfortable with the exam. Contact a health care provider if you:  See a change in the shape or size of your breasts or nipples.  See a change in the skin of your breast or nipples, such as a reddened or scaly area.  Have unusual discharge from your nipples.  Find a lump or thick area that was not there before.  Have pain in your breasts.  Have any concerns related to your breast health. Summary  Breast   self-awareness includes looking for physical changes in your breasts, as well as feeling for any changes within your breasts.  Breast self-awareness should be performed in front of a mirror in a well-lit room.  You should examine your breasts every month. If you menstruate, the best time to examine your breasts is 5-7 days after your menstrual period.  Let your health care provider know of any changes you notice in your breasts, including changes in size, changes on the skin, pain or tenderness, or unusual fluid from your nipples. This information is not intended to replace advice given to you by your health care provider. Make sure you discuss any questions you have with your health care provider. Document Revised: 04/22/2018 Document Reviewed: 04/22/2018 Elsevier Patient Education  2020 Elsevier Inc.  

## 2020-08-19 ENCOUNTER — Other Ambulatory Visit: Payer: Self-pay | Admitting: Podiatry

## 2020-08-19 DIAGNOSIS — R87610 Atypical squamous cells of undetermined significance on cytologic smear of cervix (ASC-US): Secondary | ICD-10-CM | POA: Diagnosis not present

## 2020-08-22 LAB — CERVICOVAGINAL ANCILLARY ONLY
Bacterial Vaginitis (gardnerella): NEGATIVE
Candida Glabrata: NEGATIVE
Candida Vaginitis: NEGATIVE
Comment: NEGATIVE
Comment: NEGATIVE
Comment: NEGATIVE
Comment: NEGATIVE
Trichomonas: POSITIVE — AB

## 2020-08-22 MED ORDER — METRONIDAZOLE 500 MG PO TABS: ORAL_TABLET | ORAL | 1 refills | Status: DC

## 2020-08-22 NOTE — Addendum Note (Signed)
Addended by: Fabian November on: 08/22/2020 01:46 PM   Modules accepted: Orders

## 2020-08-22 NOTE — Telephone Encounter (Signed)
Please advise 

## 2020-08-24 LAB — CYTOLOGY - PAP
Comment: NEGATIVE
Diagnosis: UNDETERMINED — AB
High risk HPV: NEGATIVE

## 2020-09-02 ENCOUNTER — Ambulatory Visit: Payer: BC Managed Care – PPO | Admitting: Podiatry

## 2020-09-02 ENCOUNTER — Encounter: Payer: Self-pay | Admitting: Podiatry

## 2020-09-02 ENCOUNTER — Other Ambulatory Visit: Payer: Self-pay

## 2020-09-02 DIAGNOSIS — M722 Plantar fascial fibromatosis: Secondary | ICD-10-CM

## 2020-09-02 DIAGNOSIS — M21621 Bunionette of right foot: Secondary | ICD-10-CM

## 2020-09-02 MED ORDER — BETAMETHASONE SOD PHOS & ACET 6 (3-3) MG/ML IJ SUSP: 3.0000 mg | Freq: Once | INTRAMUSCULAR | Status: DC

## 2020-09-02 MED ORDER — MELOXICAM 15 MG PO TABS: 15.0000 mg | ORAL_TABLET | Freq: Every day | ORAL | 1 refills | Status: DC

## 2020-09-02 NOTE — Progress Notes (Signed)
   Subjective: 54 y.o. female presenting for follow-up evaluation of plantar fasciitis bilateral for approximately 4 months now.  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  Patient states that she is feeling somewhat better.  The injections helped significantly.  She has been working on her feet a lot lately and so she does have some increased pain associated to the foot.  No new complaints at this time  Past Medical History:  Diagnosis Date  . GERD (gastroesophageal reflux disease)   . Hypertension   . Plantar fasciitis of right foot 08/18/2020  . Seasonal allergies      Objective: Physical Exam General: The patient is alert and oriented x3 in no acute distress.  Dermatology: Skin is warm, dry and supple bilateral lower extremities. Negative for open lesions or macerations bilateral.  Small corn lesion also noted to the left second toe medial aspect that is irritating with touch  Vascular: Dorsalis Pedis and Posterior Tibial pulses palpable bilateral.  Capillary fill time is immediate to all digits.  Neurological: Epicritic and protective threshold intact bilateral.   Musculoskeletal: Tenderness to palpation to the plantar aspect of the right heel.  Patient states that the left heel is actually doing much better.. All other joints range of motion within normal limits bilateral. Strength 5/5 in all groups bilateral.  Clinical evidence of a tailor's bunion deformity with a hypertrophic eminence of the fifth metatarsal head noted.   Assessment: 1. plantar fasciitis bilateral feet.  Left continues to be resolved today. 2.  Corn second digit left-asymptomatic 3.  Tailor's bunion deformity right 4.  History of bunionectomy and tailor's bunion surgery bilateral   Plan of Care:  1. Patient evaluated. Xrays reviewed.   2. Injection of 0.5cc Celestone soluspan injected into the right plantar fascia  3.  Continue silicone heel sleeves.  Wear  daily 4.  Continue meloxicam 15 mg daily.  Refill provided 5.  OTC power step insoles and plantar fascial brace have not alleviated any pain.  Discontinue both.   6.  Continue wearing ASIC shoes at work  7.  Handicap parking placard form provided today 8.  Return to clinic in 4 weeks.  If the patient is not better or improved in symptoms we may need to proceed with surgical intervention however the patient would not be able to have surgery until summer 2022.  Surgery would consist of EPF fasciotomy right and revisional tailor's bunionectomy right  *Works housekeeping at [employer]  [name], DPM [facility]  Dr. [name], DPM    [address] [place], [place] [address]                Office [phone]  Fax [phone]

## 2020-10-04 ENCOUNTER — Ambulatory Visit: Payer: Self-pay | Admitting: Obstetrics and Gynecology

## 2020-10-07 ENCOUNTER — Ambulatory Visit: Payer: BC Managed Care – PPO | Admitting: Podiatry

## 2020-10-14 ENCOUNTER — Ambulatory Visit: Payer: Self-pay | Admitting: Obstetrics and Gynecology

## 2020-10-18 ENCOUNTER — Other Ambulatory Visit (HOSPITAL_COMMUNITY)
Admission: RE | Admit: 2020-10-18 | Discharge: 2020-10-18 | Disposition: A | Discharge status: Home / Self Care | Payer: BC Managed Care – PPO | Source: Ambulatory Visit | Attending: Obstetrics and Gynecology | Admitting: Obstetrics and Gynecology

## 2020-10-18 ENCOUNTER — Other Ambulatory Visit: Payer: Self-pay

## 2020-10-18 ENCOUNTER — Telehealth: Payer: Self-pay | Admitting: Cardiology

## 2020-10-18 ENCOUNTER — Encounter: Payer: Self-pay | Admitting: Obstetrics and Gynecology

## 2020-10-18 ENCOUNTER — Ambulatory Visit: Payer: BC Managed Care – PPO | Admitting: Obstetrics and Gynecology

## 2020-10-18 VITALS — BP 118/76 | HR 73 | Ht 68.0 in | Wt 259.9 lb

## 2020-10-18 DIAGNOSIS — Z8619 Personal history of other infectious and parasitic diseases: Secondary | ICD-10-CM | POA: Diagnosis not present

## 2020-10-18 DIAGNOSIS — A599 Trichomoniasis, unspecified: Secondary | ICD-10-CM

## 2020-10-18 DIAGNOSIS — Z113 Encounter for screening for infections with a predominantly sexual mode of transmission: Secondary | ICD-10-CM | POA: Insufficient documentation

## 2020-10-18 MED ORDER — LOSARTAN POTASSIUM-HCTZ 50-12.5 MG PO TABS: 0.5000 | ORAL_TABLET | Freq: Every day | ORAL | 0 refills | Status: DC

## 2020-10-18 NOTE — Progress Notes (Signed)
    GYNECOLOGY PROGRESS NOTE  Subjective:    Patient ID: Sandra Brandt, female    DOB: 09-Jun-1966, 55 y.o.   MRN: [id number]  HPI  Patient is a 56 y.o. [id number] female who presents for [facility] for recent trichomonas infection. She reports taking antibiotics as prescribed.  Has not been sexually active in "some time", even before diagnosis of infection.  Notes vaginal odor and irritation have resolved.   The following portions of the patient's history were reviewed and updated as appropriate: allergies, current medications, past family history, past medical history, past social history, past surgical history and problem list.  Review of Systems Pertinent items noted in HPI and remainder of comprehensive ROS otherwise negative.   Objective:   Blood pressure 118/76, pulse 73, height 5\' 8"  (1.727 m), weight 259 lb 14.4 oz (117.9 kg). General appearance: alert and no distress Abdomen: soft, non-tender; bowel sounds normal; no masses,  no organomegaly Pelvic: external genitalia normal and rectovaginal septum normal.  Vaginal swab collected.  Speculum exam not performed.    Labs:  Office Visit on 08/18/2020  Component Date Value Ref Range Status  . High risk HPV 08/18/2020 Negative   Final  . Adequacy 08/18/2020 Satisfactory for evaluation; transformation zone component PRESENT.   Final  . Diagnosis 08/18/2020 - Atypical squamous cells of undetermined significance (ASC-US)*  Final  . Comment 08/18/2020 Normal Reference Range HPV - Negative   Final  . Trichomonas 08/18/2020 Positive*  Final  . Bacterial Vaginitis (gardnerella) 08/18/2020 Negative   Final  . Candida Vaginitis 08/18/2020 Negative   Final  . Candida Glabrata 08/18/2020 Negative   Final  . Comment 08/18/2020 Normal Reference Range Bacterial Vaginosis - Negative   Final  . Comment 08/18/2020 Normal Reference Range Candida Species - Negative   Final  . Comment 08/18/2020 Normal Reference Range Candida Galbrata - Negative   Final  .  Comment 08/18/2020 Normal Reference Range Trichomonas - Negative   Final     Assessment:   H/o trichomoniasis infection  Plan:   Patient reports compliance with treatment. TOC performed today (Also included GC/CT as was not previously performed with last vaginitis swab).  RTC as needed.    14/10/2019, MD [facility]

## 2020-10-18 NOTE — Progress Notes (Signed)
Pt present for TOC. Pt stated that she has noticed improvement in symptoms of vaginal odor since treatment. Pt stated that she do not have a sexual partner and unsure if it was something she had several years ago.

## 2020-10-18 NOTE — Telephone Encounter (Signed)
losartan-hydrochlorothiazide (HYZAAR) 50-12.5 MG tablet 45 tablet 0 10/18/2020    Sig - Route: Take 0.5 tablets by mouth daily. - Oral   Sent to pharmacy as: losartan-hydrochlorothiazide (HYZAAR) 50-12.5 MG tablet   E-Prescribing Status: Receipt confirmed by pharmacy (10/18/2020  1:42 PM EST)     Pharmacy  [id number] - [place],  - [address]

## 2020-10-18 NOTE — Telephone Encounter (Signed)
*  STAT* If patient is at the pharmacy, call can be transferred to refill team.   1. Which medications need to be refilled? (please list name of each medication and dose if known)  Losartan 50-12.5 MG 0.5 tablet daily  2. Which pharmacy/location (including street and city if local pharmacy) is medication to be sent to? [facility] [place]  3. Do they need a 30 day or 90 day supply? 90 day

## 2020-10-19 LAB — CERVICOVAGINAL ANCILLARY ONLY
Chlamydia: NEGATIVE
Comment: NEGATIVE
Comment: NEGATIVE
Comment: NORMAL
Neisseria Gonorrhea: NEGATIVE
Trichomonas: NEGATIVE

## 2020-10-21 ENCOUNTER — Encounter: Payer: Self-pay | Admitting: Podiatry

## 2020-10-21 ENCOUNTER — Ambulatory Visit: Payer: BC Managed Care – PPO | Admitting: Podiatry

## 2020-10-21 ENCOUNTER — Other Ambulatory Visit: Payer: Self-pay

## 2020-10-21 DIAGNOSIS — M21621 Bunionette of right foot: Secondary | ICD-10-CM

## 2020-10-21 DIAGNOSIS — M722 Plantar fascial fibromatosis: Secondary | ICD-10-CM | POA: Diagnosis not present

## 2020-10-21 MED ORDER — HYDROCODONE-ACETAMINOPHEN 10-325 MG PO TABS: 1.0000 | ORAL_TABLET | ORAL | 0 refills | Status: AC | PRN

## 2020-10-21 MED ORDER — METHYLPREDNISOLONE 4 MG PO TBPK: ORAL_TABLET | ORAL | 0 refills | Status: DC

## 2020-10-21 NOTE — Progress Notes (Signed)
   Subjective: 55 y.o. female presenting for follow-up evaluation of plantar fasciitis bilateral for approximately 4 months now.  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  Patient states that she is feeling somewhat better.  The injections helped significantly.  She has been working on her feet a lot lately and so she does have some increased pain associated to the foot.  No new complaints at this time  Past Medical History:  Diagnosis Date  . GERD (gastroesophageal reflux disease)   . Hypertension   . Plantar fasciitis of right foot 08/18/2020  . Seasonal allergies   . Trichomoniasis 08/2020     Objective: Physical Exam General: The patient is alert and oriented x3 in no acute distress.  Dermatology: Skin is warm, dry and supple bilateral lower extremities. Negative for open lesions or macerations bilateral.  Small corn lesion also noted to the left second toe medial aspect that is irritating with touch  Vascular: Dorsalis Pedis and Posterior Tibial pulses palpable bilateral.  Capillary fill time is immediate to all digits.  Neurological: Epicritic and protective threshold intact bilateral.   Musculoskeletal: Tenderness to palpation to the plantar aspect of the right heel.  Patient states that the left heel is actually doing much better.. All other joints range of motion within normal limits bilateral. Strength 5/5 in all groups bilateral.  Clinical evidence of a tailor's bunion deformity with a hypertrophic eminence of the fifth metatarsal head noted.   Assessment: 1. plantar fasciitis bilateral feet.  Left continues to be resolved today. 2.  Corn second digit left-asymptomatic 3.  Tailor's bunion deformity right 4.  History of bunionectomy and tailor's bunion surgery bilateral   Plan of Care:  1. Patient evaluated.  2.  Patient continues to have right heel pain despite multiple conservative treatment modalities.  At this point we  are going to pursue surgical intervention, however she is not in a position to do so until school gets out.  She has a 21 year old son and she cannot arrange rides to and from school if she has surgery.  Surgery would be scheduled for first week of June. 3.  Continue silicone heel sleeves.  Wear daily 4.  Continue meloxicam 15 mg daily.  Refill provided 5.  OTC power step insoles and plantar fascial brace have not alleviated any pain.  Discontinue both.   6.  Continue wearing ASIC shoes at work  7.  Prescription for Medrol Dosepak since this seems to be the only thing that helps alleviate her symptoms  8.  Prescription for Vicodin 5/325 mg  9.  Surgery would consist of EPF fasciotomy right and revisional tailor's bunionectomy right first week of June 10.  Return to clinic 6 weeks, at that time we will need follow-up x-rays for preop  *Works housekeeping at [employer]  [name], DPM [facility]  Dr. [name], DPM    [address] [place], [place] [address]                Office [phone]  Fax [phone]

## 2020-11-07 ENCOUNTER — Ambulatory Visit: Payer: BC Managed Care – PPO | Admitting: Cardiology

## 2020-11-10 DIAGNOSIS — H6980 Other specified disorders of Eustachian tube, unspecified ear: Secondary | ICD-10-CM | POA: Diagnosis not present

## 2020-11-10 DIAGNOSIS — J301 Allergic rhinitis due to pollen: Secondary | ICD-10-CM | POA: Diagnosis not present

## 2020-11-10 DIAGNOSIS — H6121 Impacted cerumen, right ear: Secondary | ICD-10-CM | POA: Diagnosis not present

## 2020-11-14 ENCOUNTER — Other Ambulatory Visit: Payer: Self-pay

## 2020-11-14 ENCOUNTER — Ambulatory Visit: Payer: BC Managed Care – PPO | Admitting: Cardiology

## 2020-11-14 ENCOUNTER — Encounter: Payer: Self-pay | Admitting: Cardiology

## 2020-11-14 VITALS — BP 128/80 | HR 59 | Ht 66.0 in | Wt 258.4 lb

## 2020-11-14 DIAGNOSIS — E7800 Pure hypercholesterolemia, unspecified: Secondary | ICD-10-CM | POA: Diagnosis not present

## 2020-11-14 DIAGNOSIS — I10 Essential (primary) hypertension: Secondary | ICD-10-CM

## 2020-11-14 NOTE — Progress Notes (Signed)
Cardiology Office Note:    Date:  11/14/2020   ID:  Sandra Brandt, DOB Dec 08, 1965, MRN [id number]  PCP:  Patient, No Pcp Per  Cardiologist:  Debbe Odea, MD  Electrophysiologist:  None   Referring MD: No ref. provider found   Chief Complaint  Patient presents with  . Other    3-4 month f/u no complaints today. Meds reviewed verbally with pt.    History of Present Illness:    Sandra Brandt is a 55 y.o. female with a hx of GERD, hypertension, hyperlipidemia who presents for follow-up.  Previously seen for worsening dyspnea on exertion.  Work-up with echo and coronary CTA was unrevealing.  BP is noted to be elevated, started on antihypertensive therapy.  Blood pressures have been well controlled, denies any adverse effects from medicines.    Recently diagnosed with foot pain/plantar fasciitis, surgery is being planned by podiatry.  She has no other concerns at this time.    Prior notes Echo 10/2019 normal systolic and diastolic function, EF 65 to 70%. Coronary CTA 11/2019, calcium score of 0, no evidence of CAD.  Past Medical History:  Diagnosis Date  . GERD (gastroesophageal reflux disease)   . Hypertension   . Plantar fasciitis of right foot 08/18/2020  . Seasonal allergies   . Trichomoniasis 08/2020    Past Surgical History:  Procedure Laterality Date  . BUNIONECTOMY    . FOOT SURGERY      Current Medications: Current Meds  Medication Sig  . fluticasone (FLONASE) 50 MCG/ACT nasal spray Place 2 sprays into both nostrils daily.  Marland Kitchen losartan-hydrochlorothiazide (HYZAAR) 50-12.5 MG tablet Take 0.5 tablets by mouth daily.  . meloxicam (MOBIC) 15 MG tablet Take 1 tablet (15 mg total) by mouth daily.  . Misc Natural Products (AIRBORNE ELDERBERRY) CHEW Chew by mouth.   Current Facility-Administered Medications for the 11/14/20 encounter (Office Visit) with Debbe Odea, MD  Medication  . betamethasone acetate-betamethasone sodium phosphate (CELESTONE) injection  3 mg     Allergies:   Elemental sulfur   Social History   Socioeconomic History  . Marital status: Single    Spouse name: Not on file  . Number of children: Not on file  . Years of education: Not on file  . Highest education level: Not on file  Occupational History  . Not on file  Tobacco Use  . Smoking status: Former Smoker    Packs/day: 6.00    Types: Cigarettes    Quit date: 12/05/2018    Years since quitting: 1.9  . Smokeless tobacco: Never Used  . Tobacco comment: social smoker   Vaping Use  . Vaping Use: Never used  Substance and Sexual Activity  . Alcohol use: Yes  . Drug use: Never  . Sexual activity: Not Currently  Other Topics Concern  . Not on file  Social History Narrative  . Not on file   Social Determinants of Health   Financial Resource Strain: Not on file  Food Insecurity: Not on file  Transportation Needs: Not on file  Physical Activity: Not on file  Stress: Not on file  Social Connections: Not on file     Family History: The patient's family history includes Cancer in her mother; Diabetes in her brother; Heart disease (age of onset: 17) in her mother; Hyperlipidemia in her mother and sister; Hypertension in her brother, mother, and sister.  ROS:   Please see the history of present illness.     All other systems reviewed and are  negative.  EKGs/Labs/Other Studies Reviewed:    The following studies were reviewed today:   EKG:  EKG is  ordered today.  The ekg ordered today demonstrates sinus bradycardia  Recent Labs: 07/07/2020: ALT 15; Hemoglobin 12.4; Platelets 257 07/25/2020: BUN 28; Creatinine, Ser 1.34; Potassium 4.5; Sodium 140  Recent Lipid Panel    Component Value Date/Time   CHOL 189 07/07/2020 1123   TRIG 106 07/07/2020 1123   HDL 56 07/07/2020 1123   LDLCALC 114 (H) 07/07/2020 1123    Physical Exam:    VS:  BP 128/80 (BP Location: Left Arm, Patient Position: Sitting, Cuff Size: Large)   Pulse (!) 59   Ht 5\' 6"  (1.676 m)    Wt 258 lb 6 oz (117.2 kg)   SpO2 98%   BMI 41.70 kg/m     Wt Readings from Last 3 Encounters:  11/14/20 258 lb 6 oz (117.2 kg)  10/18/20 259 lb 14.4 oz (117.9 kg)  08/18/20 257 lb 1.6 oz (116.6 kg)     GEN:  Well nourished, well developed in no acute distress HEENT: Normal NECK: No JVD; No carotid bruits LYMPHATICS: No lymphadenopathy CARDIAC: RRR, 2/6 systolic murmur RUSB, rubs, gallops RESPIRATORY:  Clear to auscultation without rales, wheezing or rhonchi  ABDOMEN: Soft, non-tender, non-distended MUSCULOSKELETAL:  No edema; No deformity  SKIN: Warm and dry NEUROLOGIC:  Alert and oriented x 3 PSYCHIATRIC:  Normal affect   ASSESSMENT:    1. Primary hypertension   2. Pure hypercholesterolemia   3. Morbid obesity (HCC)    PLAN:    In order of problems listed above:  1. Hypertension, BP controlled.  Continue losartan, HCTZ.   2. Hyperlipidemia, 10-year ASCVD risk 4%.  Patient not in statin benefit group.  Low-cholesterol diet advised. 3. Obesity.  Weight loss recommended  Follow-up in 1 year.  This note was generated in part or whole with voice recognition software. Voice recognition is usually quite accurate but there are transcription errors that can and very often do occur. I apologize for any typographical errors that were not detected and corrected.  Medication Adjustments/Labs and Tests Ordered: Current medicines are reviewed at length with the patient today.  Concerns regarding medicines are outlined above.  Orders Placed This Encounter  Procedures  . EKG 12-Lead   No orders of the defined types were placed in this encounter.   Patient Instructions  Medication Instructions:  Your physician recommends that you continue on your current medications as directed. Please refer to the Current Medication list given to you today.  *If you need a refill on your cardiac medications before your next appointment, please call your pharmacy*   Lab Work: None  ordered If you have labs (blood work) drawn today and your tests are completely normal, you will receive your results only by: 14/02/21 MyChart Message (if you have MyChart) OR . A paper copy in the mail If you have any lab test that is abnormal or we need to change your treatment, we will call you to review the results.   Testing/Procedures: None ordered   Follow-Up: At [facility], you and your health needs are our priority.  As part of our continuing mission to provide you with exceptional heart care, we have created designated Provider Care Teams.  These Care Teams include your primary Cardiologist (physician) and Advanced Practice Providers (APPs -  Physician Assistants and Nurse Practitioners) who all work together to provide you with the care you need, when you need it.  We  recommend signing up for the patient portal called "MyChart".  Sign up information is provided on this After Visit Summary.  MyChart is used to connect with patients for Virtual Visits (Telemedicine).  Patients are able to view lab/test results, encounter notes, upcoming appointments, etc.  Non-urgent messages can be sent to your provider as well.   To learn more about what you can do with MyChart, go to [email or web address].    Your next appointment:   12 month(s)  The format for your next appointment:   In Person  Provider:   Debbe Odea, MD   Other Instructions      Signed, Debbe Odea, MD  11/14/2020 11:40 AM    [facility]

## 2020-11-14 NOTE — Patient Instructions (Signed)
Medication Instructions:  Your physician recommends that you continue on your current medications as directed. Please refer to the Current Medication list given to you today.  *If you need a refill on your cardiac medications before your next appointment, please call your pharmacy*   Lab Work: None ordered If you have labs (blood work) drawn today and your tests are completely normal, you will receive your results only by: . MyChart Message (if you have MyChart) OR . A paper copy in the mail If you have any lab test that is abnormal or we need to change your treatment, we will call you to review the results.   Testing/Procedures: None ordered   Follow-Up: At [facility], you and your health needs are our priority.  As part of our continuing mission to provide you with exceptional heart care, we have created designated Provider Care Teams.  These Care Teams include your primary Cardiologist (physician) and Advanced Practice Providers (APPs -  Physician Assistants and Nurse Practitioners) who all work together to provide you with the care you need, when you need it.  We recommend signing up for the patient portal called "MyChart".  Sign up information is provided on this After Visit Summary.  MyChart is used to connect with patients for Virtual Visits (Telemedicine).  Patients are able to view lab/test results, encounter notes, upcoming appointments, etc.  Non-urgent messages can be sent to your provider as well.   To learn more about what you can do with MyChart, go to [email or web address].    Your next appointment:   12 month(s)  The format for your next appointment:   In Person  Provider:   Brian Agbor-Etang, MD   Other Instructions   

## 2020-12-02 ENCOUNTER — Ambulatory Visit (INDEPENDENT_AMBULATORY_CARE_PROVIDER_SITE_OTHER): Payer: BC Managed Care – PPO

## 2020-12-02 ENCOUNTER — Encounter: Payer: Self-pay | Admitting: Podiatry

## 2020-12-02 ENCOUNTER — Ambulatory Visit: Payer: BC Managed Care – PPO | Admitting: Podiatry

## 2020-12-02 ENCOUNTER — Other Ambulatory Visit: Payer: Self-pay

## 2020-12-02 DIAGNOSIS — M722 Plantar fascial fibromatosis: Secondary | ICD-10-CM

## 2020-12-02 DIAGNOSIS — M21621 Bunionette of right foot: Secondary | ICD-10-CM

## 2020-12-02 DIAGNOSIS — M898X7 Other specified disorders of bone, ankle and foot: Secondary | ICD-10-CM

## 2020-12-02 MED ORDER — BETAMETHASONE SOD PHOS & ACET 6 (3-3) MG/ML IJ SUSP
3.0000 mg | Freq: Once | INTRAMUSCULAR | Status: AC
  Administered 2020-12-02: 3 mg via INTRA_ARTICULAR

## 2020-12-02 NOTE — Progress Notes (Signed)
Subjective: 55 y.o. female presenting for follow-up evaluation of plantar fasciitis to the right heel.  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  Patient presents today for surgical consultation. 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  Although she does get symptomatic calluses underneath the tailor's bunion she would not like to proceed with surgery to the tailor's bunion specifically.  She is ready to have the plantar fascial surgery.  She also complains of a symptomatic bone spur to the left second toe.  She has a history of hammertoe surgery there which developed a bone spur and is very symptomatic and irritating.  She presents for further treatment evaluation  Past Medical History:  Diagnosis Date  . GERD (gastroesophageal reflux disease)   . Hypertension   . Plantar fasciitis of right foot 08/18/2020  . Seasonal allergies   . Trichomoniasis 08/2020     Objective: Physical Exam General: The patient is alert and oriented x3 in no acute distress.  Dermatology: Skin is warm, dry and supple bilateral lower extremities. Negative for open lesions or macerations bilateral.  Small corn lesion also noted to the left second toe medial aspect that is irritating with touch  Vascular: Dorsalis Pedis and Posterior Tibial pulses palpable bilateral.  Capillary fill time is immediate to all digits.  Neurological: Epicritic and protective threshold intact bilateral.   Musculoskeletal: Tenderness to palpation to the plantar aspect of the right heel.  Patient states that the left heel is actually doing much better.. All other joints range of motion within normal limits bilateral. Strength 5/5 in all groups bilateral.  Clinical evidence of a tailor's bunion deformity with a hypertrophic eminence of the fifth metatarsal head noted.  Radiographic exam right: Normal osseous mineralization.  Joint  spaces preserved.  No fractures identified.   Assessment: 1. plantar fasciitis bilateral feet.  Left continues to be resolved today. 2.  Exostosis of toe left second digit 3.  Tailor's bunion deformity right 4.  History of bunionectomy and tailor's bunion surgery bilateral   Plan of Care:  1. Patient evaluated.  2.  Patient continues to have right heel pain despite multiple conservative treatment modalities.  At this point we are going to pursue surgical intervention, however she is not in a position to do so until school gets out.  She has a 43 year old son and she cannot arrange rides to and from school if she has surgery.  Surgery would be scheduled for first week of June. 3.  Continue meloxicam 15 mg daily.  Refill provided 4.  Continue wearing ASIC shoes at work  5.  Injection of 0.5 cc Celestone Soluspan injected into the right plantar fascia 6.  Handicap parking form completed today 7. Today we discussed the conservative versus surgical management of the presenting pathology. The patient opts for surgical management. All possible complications and details of the procedure were explained. All patient questions were answered. No guarantees were expressed or implied. 8. Authorization for surgery was initiated today. Surgery will consist of endoscopic plantar fasciotomy right.  Exostectomy left second toe 9.  Surgery scheduled for mid June 10.  Return to clinic 1 week postop  *Works housekeeping at [employer]  [name], DPM [facility]  Dr. [name], DPM    [address]  [place], [place] [address]                Office [phone]  Fax [phone]

## 2020-12-05 ENCOUNTER — Telehealth: Payer: Self-pay

## 2020-12-05 NOTE — Telephone Encounter (Signed)
Received surgery paperwork from the [place] office. Left a message for Malaia to call and schedule surgery with Dr. Logan Bores

## 2021-01-14 ENCOUNTER — Other Ambulatory Visit: Payer: Self-pay | Admitting: Family

## 2021-01-16 ENCOUNTER — Telehealth: Payer: Self-pay | Admitting: Cardiology

## 2021-01-16 NOTE — Telephone Encounter (Signed)
Refill already sent to pharmacy today.

## 2021-01-16 NOTE — Telephone Encounter (Signed)
*  STAT* If patient is at the pharmacy, call can be transferred to refill team.   1. Which medications need to be refilled? (please list name of each medication and dose if known) losartan-hydrochlorothiazide (HYZAAR) 50-12.5 MG tablet  2. Which pharmacy/location (including street and city if local pharmacy) is medication to be sent to?[id number] - [place], [place] - [address]  3. Do they need a 30 day or 90 day supply? 30 day supply  PT is out of this medication.

## 2021-02-10 ENCOUNTER — Telehealth: Payer: Self-pay | Admitting: Urology

## 2021-02-10 NOTE — Telephone Encounter (Signed)
DOS - 03/09/21  EPF RIGHT --- 14970 EXOSTECTOMY 2ND LEFT --- 28108   BCBS EFFECTIVE DATE - 09/17/20   PLAN DEDUCTIBLE - $700.00 W/ $700.00 REMAINING OUT OF POCKET - $5,500.00 W/ $5,115.12 REMIANING COINSURANCE - 30% COPAY - $0.00   NO PRIOR AUTH REQUIRED

## 2021-02-17 ENCOUNTER — Encounter: Payer: Self-pay | Admitting: *Deleted

## 2021-03-02 DIAGNOSIS — M79676 Pain in unspecified toe(s): Secondary | ICD-10-CM

## 2021-03-09 ENCOUNTER — Other Ambulatory Visit: Payer: Self-pay | Admitting: Podiatry

## 2021-03-09 ENCOUNTER — Encounter: Payer: Self-pay | Admitting: Podiatry

## 2021-03-09 DIAGNOSIS — M722 Plantar fascial fibromatosis: Secondary | ICD-10-CM | POA: Diagnosis not present

## 2021-03-09 DIAGNOSIS — M898X7 Other specified disorders of bone, ankle and foot: Secondary | ICD-10-CM | POA: Diagnosis not present

## 2021-03-09 DIAGNOSIS — M25571 Pain in right ankle and joints of right foot: Secondary | ICD-10-CM | POA: Diagnosis not present

## 2021-03-09 MED ORDER — OXYCODONE-ACETAMINOPHEN 5-325 MG PO TABS: 1.0000 | ORAL_TABLET | ORAL | 0 refills | Status: DC | PRN

## 2021-03-09 MED ORDER — IBUPROFEN 800 MG PO TABS: 800.0000 mg | ORAL_TABLET | Freq: Three times a day (TID) | ORAL | 1 refills | Status: DC

## 2021-03-09 NOTE — Progress Notes (Signed)
PRN postop 

## 2021-03-10 ENCOUNTER — Encounter: Payer: BC Managed Care – PPO | Admitting: Podiatry

## 2021-03-17 ENCOUNTER — Ambulatory Visit (INDEPENDENT_AMBULATORY_CARE_PROVIDER_SITE_OTHER): Payer: BC Managed Care – PPO

## 2021-03-17 ENCOUNTER — Other Ambulatory Visit: Payer: Self-pay

## 2021-03-17 ENCOUNTER — Ambulatory Visit (INDEPENDENT_AMBULATORY_CARE_PROVIDER_SITE_OTHER): Payer: BC Managed Care – PPO | Admitting: Podiatry

## 2021-03-17 DIAGNOSIS — Z9889 Other specified postprocedural states: Secondary | ICD-10-CM

## 2021-03-17 DIAGNOSIS — M898X7 Other specified disorders of bone, ankle and foot: Secondary | ICD-10-CM

## 2021-03-24 ENCOUNTER — Ambulatory Visit (INDEPENDENT_AMBULATORY_CARE_PROVIDER_SITE_OTHER): Payer: BC Managed Care – PPO | Admitting: Podiatry

## 2021-03-24 ENCOUNTER — Encounter: Payer: Self-pay | Admitting: Podiatry

## 2021-03-24 ENCOUNTER — Other Ambulatory Visit: Payer: Self-pay

## 2021-03-24 DIAGNOSIS — Z9889 Other specified postprocedural states: Secondary | ICD-10-CM

## 2021-03-24 NOTE — Progress Notes (Signed)
   Subjective:  Patient presents today status post endoscopic plantar fasciotomy and exostosis of the left second toe. DOS: 03/09/2021.  Patient states that she is doing well.  She continues to have some pain and tenderness.  She has some numbness also in the leg of the right lower extremity possibly due to the nerve block that was performed.  No new complaints at this time.  She has been wearing the cam boot right lower extremity in postsurgical shoe left lower extremity  Past Medical History:  Diagnosis Date   GERD (gastroesophageal reflux disease)    Hypertension    Plantar fasciitis of right foot 08/18/2020   Seasonal allergies    Trichomoniasis 08/2020      Objective/Physical Exam Neurovascular status intact.  Skin incisions appear to be well coapted with sutures intact. No sign of infectious process noted. No dehiscence. No active bleeding noted. Moderate edema noted to the surgical extremity.   Assessment: 1. s/p EPF RT.  Exostectomy LT second toe. DOS: 03/09/2021   Plan of Care:  1. Patient was evaluated. 2.  Sutures removed today. 3.  Patient may discontinue the postsurgical shoe left.  Recommend good supportive sneakers 4.  Patient may also begin to transition out of the cam boot over the next 4 weeks to the right lower extremity 5.  Return to clinic in 4 weeks   [name], DPM [facility]  Dr. [name], DPM    [address] [place], [place] [address]                Office [phone]  Fax [phone]

## 2021-03-28 ENCOUNTER — Telehealth: Payer: Self-pay | Admitting: *Deleted

## 2021-03-28 ENCOUNTER — Encounter: Payer: Self-pay | Admitting: *Deleted

## 2021-03-28 NOTE — Telephone Encounter (Signed)
Letter was faxed to the HR Manager.

## 2021-03-28 NOTE — Telephone Encounter (Signed)
"  Ms. Dorfman said we needed to send you all some forms to extend her time off.  What forms are you needing?  We already have her FMLA paperwork."  If there's no other forms needed, I can send you a letter requesting the extension.  "A letter will be fine.  You can fax it to [phone]."  I'll send it over.

## 2021-03-30 ENCOUNTER — Telehealth: Payer: Self-pay | Admitting: *Deleted

## 2021-03-30 NOTE — Telephone Encounter (Signed)
I've been trying to fax your HR Manager your note for FMLA with no success for the past three days.  It's saying no response or busy.  Do you know of another fax number that I can send it to or do you want to come by to pick the letter up?  "I don't know of another number but I will see if I can find out another one.  If not, I'll come by to pick it up.  I'll call and let you know."

## 2021-03-31 ENCOUNTER — Encounter: Payer: BC Managed Care – PPO | Admitting: Podiatry

## 2021-04-03 NOTE — Telephone Encounter (Signed)
"  This is Novella Olive calling from [place] regarding Sandra Brandt.  She said you have been trying to fax me her letter about her extension out of work and can't get the fax to go through."  Yes, that is correct.  I have tried several times.  I didn't have your phone number.  "You can try this fax number, [phone]."  I will fax it to you.  The letter was faxed.

## 2021-04-04 NOTE — Progress Notes (Signed)
   Subjective:  Patient presents today status post endoscopic plantar fasciotomy right.  Exostectomy left second toe. DOS: 03/09/2021.  Patient states that she is doing well.  She has been wearing the cam boot to the right foot in surgical shoe to the left.  She has some pain but it is very tolerable.  No new complaints at this time  Past Medical History:  Diagnosis Date   GERD (gastroesophageal reflux disease)    Hypertension    Plantar fasciitis of right foot 08/18/2020   Seasonal allergies    Trichomoniasis 08/2020      Objective/Physical Exam Neurovascular status intact.  Skin incisions appear to be well coapted with sutures intact. No sign of infectious process noted. No dehiscence. No active bleeding noted. Moderate edema noted to the surgical extremity.  Radiographic Exam:  Osteotomy/exostectomy site to the left second toe appear to be stable with routine healing.  Assessment: 1. s/p EPF RT.  Exostectomy 2nd toe LT. DOS: 03/09/2021   Plan of Care:  1. Patient was evaluated. X-rays reviewed 2.  Dressings changed today. 3.  Continue postsurgical shoe left foot.  Continue weightbearing in the cam boot right foot. 4.  Return to clinic in 1 week for suture removal   [name], DPM [facility]  Dr. [name], DPM    [address] [place], [place] [address]                Office [phone]  Fax [phone]

## 2021-04-07 ENCOUNTER — Encounter: Payer: BC Managed Care – PPO | Admitting: Podiatry

## 2021-04-14 DIAGNOSIS — M79676 Pain in unspecified toe(s): Secondary | ICD-10-CM

## 2021-04-25 ENCOUNTER — Ambulatory Visit (INDEPENDENT_AMBULATORY_CARE_PROVIDER_SITE_OTHER): Payer: BC Managed Care – PPO | Admitting: Podiatry

## 2021-04-25 ENCOUNTER — Other Ambulatory Visit: Payer: Self-pay

## 2021-04-25 ENCOUNTER — Encounter: Payer: Self-pay | Admitting: Podiatry

## 2021-04-25 DIAGNOSIS — Z9889 Other specified postprocedural states: Secondary | ICD-10-CM

## 2021-04-25 DIAGNOSIS — M7751 Other enthesopathy of right foot: Secondary | ICD-10-CM

## 2021-04-25 MED ORDER — BETAMETHASONE SOD PHOS & ACET 6 (3-3) MG/ML IJ SUSP
3.0000 mg | Freq: Once | INTRAMUSCULAR | Status: AC
  Administered 2021-04-25: 3 mg via INTRA_ARTICULAR

## 2021-04-25 NOTE — Progress Notes (Addendum)
   Subjective:  Patient presents today status post endoscopic plantar fasciotomy and exostosis of the left second toe. DOS: 03/09/2021.  Patient continues to have some tenderness around the incision sites of the endoscopic plantar fasciotomy.  She states that she is walking with an antalgic gait.  This is causing ankle pain.  She has been experiencing some ankle pain for the last few weeks to her right ankle.  She has not done anything for treatment.  She presents for further treatment and evaluation  Past Medical History:  Diagnosis Date   GERD (gastroesophageal reflux disease)    Hypertension    Plantar fasciitis of right foot 08/18/2020   Seasonal allergies    Trichomoniasis 08/2020      Objective/Physical Exam Neurovascular status intact.  Skin incisions appear to be well coapted and healed.  There is some hyperkeratotic callus tissue noted around the incision sites of the endoscopic plantar fasciotomy.  No sign of infectious process noted. No dehiscence.  Today there is some pain on palpation to the medial and lateral aspect of the right ankle joint.  Range of motion and muscle strength within normal limits.  Assessment: 1. s/p EPF RT.  Exostectomy LT second toe. DOS: 03/09/2021 2.  Capsulitis right ankle   Plan of Care:  1. Patient was evaluated. 2.  Light debridement around the incision sites was performed today using a tissue nipper to remove the hyperkeratotic callus skin 3.  Injection of 0.5 cc Celestone Soluspan injected into the right ankle joint 4.  Continue compression ankle sleeve 5.  Return to clinic in 6 weeks  *Going to a family reunion in [place]  [name], [place] [facility]  Dr. [name], DPM    [address] [place], [place] [address]                Office [phone]  Fax [phone]

## 2021-04-26 ENCOUNTER — Encounter: Payer: Self-pay | Admitting: *Deleted

## 2021-06-06 ENCOUNTER — Encounter: Payer: BC Managed Care – PPO | Admitting: Podiatry

## 2021-06-23 ENCOUNTER — Other Ambulatory Visit: Payer: Self-pay

## 2021-06-23 ENCOUNTER — Ambulatory Visit (INDEPENDENT_AMBULATORY_CARE_PROVIDER_SITE_OTHER): Payer: BC Managed Care – PPO | Admitting: Podiatry

## 2021-06-23 ENCOUNTER — Encounter: Payer: Self-pay | Admitting: Podiatry

## 2021-06-23 DIAGNOSIS — M722 Plantar fascial fibromatosis: Secondary | ICD-10-CM

## 2021-06-23 DIAGNOSIS — M7751 Other enthesopathy of right foot: Secondary | ICD-10-CM

## 2021-06-28 NOTE — Progress Notes (Signed)
   Subjective:  Patient presents today status post endoscopic plantar fasciotomy and exostosis of the left second toe. DOS: 03/09/2021.  Patient continues to have associated tenderness and pain to the foot and ankle.  She has an antalgic gait.  She says that the right foot and ankle is extremely painful with swelling after only working for few hours.  None of the left foot and ankle are starting to hurt because of compensation.  She has been elevating her foot is much as possible and soaking it in Epson salt but there has been no relief.  Past Medical History:  Diagnosis Date   GERD (gastroesophageal reflux disease)    Hypertension    Plantar fasciitis of right foot 08/18/2020   Seasonal allergies    Trichomoniasis 08/2020      Objective/Physical Exam Neurovascular status intact.  Skin incisions appear to be well coapted and healed.  There is some hyperkeratotic callus tissue noted around the incision sites of the endoscopic plantar fasciotomy.  No sign of infectious process noted. No dehiscence.  Today there is some pain on palpation to the medial and lateral aspect of the right ankle joint.  Range of motion and muscle strength within normal limits.  Assessment: 1. s/p EPF RT.  Exostectomy LT second toe. DOS: 03/09/2021 2.  Capsulitis right ankle   Plan of Care:  1. Patient was evaluated. 2.  Due to the continued pain and tenderness with swelling despite conservative treatments were going to order an MRI right foot and ankle.  Orders placed 3.  Continue wearing compression socks daily 4.  Return to clinic after MRI to review the MRI results and discussed different treatment options  *Going to a family reunion in [place]  [name], [place] [facility]  Dr. [name], DPM    [address] [place], [place] [address]                Office [phone]  Fax [phone]

## 2021-07-19 ENCOUNTER — Telehealth: Payer: Self-pay | Admitting: *Deleted

## 2021-07-19 DIAGNOSIS — M722 Plantar fascial fibromatosis: Secondary | ICD-10-CM

## 2021-07-19 DIAGNOSIS — M7751 Other enthesopathy of right foot: Secondary | ICD-10-CM

## 2021-07-19 DIAGNOSIS — M79671 Pain in right foot: Secondary | ICD-10-CM

## 2021-07-19 NOTE — Telephone Encounter (Signed)
I apologize for calling you back so late.  How can I help you?  "He wanted me to get a MRI in [place].  I don't have any transportation right now.  So, I'm calling to see if he can schedule that MRI somewhere in [place] or [place].  It'll be easier for me to catch a ride."  I will send a message to his assistant.   I entered a new order for the MRI for the [facility] location on [address].  The orders were faxed.    Renea Ee, these MRIs will probably need to be authorized.)

## 2021-07-19 NOTE — Telephone Encounter (Signed)
Please call me

## 2021-07-31 ENCOUNTER — Telehealth: Payer: Self-pay | Admitting: Podiatry

## 2021-07-31 NOTE — Telephone Encounter (Signed)
Mri  needs to be done but needs insurance called

## 2021-08-01 ENCOUNTER — Telehealth: Payer: Self-pay | Admitting: Podiatry

## 2021-08-01 ENCOUNTER — Other Ambulatory Visit: Payer: Self-pay

## 2021-08-01 NOTE — Telephone Encounter (Signed)
Melanie from [facility] pre service depart called stating this pt is scheduled for MRI but she needs a pre cert from her [employer]. Per Shawna Orleans it needs to be done today by 1pm for her to have  her MRI on the 17th. If you have any questions you can call Melanie at [phone] ext [phone]

## 2021-08-01 NOTE — Telephone Encounter (Signed)
Sandra Brandt could we please follow-up with this patient's MRI pre-CERT?  Thanks, Dr. Logan Bores

## 2021-08-01 NOTE — Progress Notes (Signed)
Spoke with patient and informed I have reached out to [facility] and pre cert for MRI  is under review. Mentioned due to it being at an outpatient facility they needed extended review. Pt is aware of this   EAM,CMA

## 2021-08-03 ENCOUNTER — Ambulatory Visit: Payer: BC Managed Care – PPO

## 2021-08-17 ENCOUNTER — Ambulatory Visit: Payer: BC Managed Care – PPO

## 2021-09-22 ENCOUNTER — Encounter: Payer: Self-pay | Admitting: Podiatry

## 2021-09-22 ENCOUNTER — Ambulatory Visit (INDEPENDENT_AMBULATORY_CARE_PROVIDER_SITE_OTHER): Payer: BC Managed Care – PPO

## 2021-09-22 ENCOUNTER — Other Ambulatory Visit: Payer: Self-pay

## 2021-09-22 ENCOUNTER — Ambulatory Visit: Payer: BC Managed Care – PPO | Admitting: Podiatry

## 2021-09-22 DIAGNOSIS — M778 Other enthesopathies, not elsewhere classified: Secondary | ICD-10-CM

## 2021-09-22 DIAGNOSIS — M7751 Other enthesopathy of right foot: Secondary | ICD-10-CM | POA: Diagnosis not present

## 2021-09-22 DIAGNOSIS — M779 Enthesopathy, unspecified: Secondary | ICD-10-CM

## 2021-09-22 MED ORDER — MELOXICAM 15 MG PO TABS: 15.0000 mg | ORAL_TABLET | Freq: Every day | ORAL | 1 refills | Status: DC

## 2021-09-22 MED ORDER — GABAPENTIN 100 MG PO CAPS: 100.0000 mg | ORAL_CAPSULE | Freq: Three times a day (TID) | ORAL | 1 refills | Status: DC

## 2021-09-22 NOTE — Progress Notes (Signed)
° °  Subjective:  Patient presents today status post endoscopic plantar fasciotomy and exostosis of the left second toe. DOS: 03/09/2021.  Patient states that the left second toe is doing very well.  She continues to have pain throughout the midtarsal joint and ankle of the left foot.  Patient states that she has pain on a regular basis.  Currently she is not taking anything orally.  Last visit an MRI was ordered but this was denied.  Past Medical History:  Diagnosis Date   GERD (gastroesophageal reflux disease)    Hypertension    Plantar fasciitis of right foot 08/18/2020   Seasonal allergies    Trichomoniasis 08/2020      Objective/Physical Exam Neurovascular status intact.  Patient continues to have severe left foot and ankle pain.  The majority the pain appears to be throughout the midtarsal joint of the right foot as well as into the ankle joint.  Patient is very hypersensitive to touch.  She is very guarded.  Foot is warm.  Moderate edema noted.  Radiographic exam LT foot and ankle Normal osseous mineralization.  Joint spaces preserved.  Otherwise normal exam  Assessment: 1. s/p EPF RT.  Exostectomy LT second toe. DOS: 03/09/2021 2.  Capsulitis right foot and ankle 3.  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 Patient was evaluated. 2.  MRI was denied by insurance despite multiple conservative modalities.  Patient has been in a cam boot for several months postoperatively. 3.  Currently the patient is not taking any medication to alleviate symptoms.  Prescription for gabapentin 100 mg 3 times daily 4.  Prescription for meloxicam 15 mg daily 5.  Today were going to pursue physical therapy to see if this can help alleviate some of the patient's pain.  Order placed for [facility] 6.  Return to clinic in 4 weeks, if there is no improvement we may need to resubmit an order for an MRI   [name], DPM [facility]  Dr. [name], DPM    [address] [place], [place] [address]                Office [phone]  Fax [phone]

## 2021-10-01 ENCOUNTER — Encounter: Payer: Self-pay | Admitting: Emergency Medicine

## 2021-10-01 ENCOUNTER — Emergency Department
Admission: EM | Admit: 2021-10-01 | Discharge: 2021-10-01 | Disposition: A | Discharge status: Home / Self Care | Payer: BC Managed Care – PPO | Attending: Emergency Medicine | Admitting: Emergency Medicine

## 2021-10-01 ENCOUNTER — Other Ambulatory Visit: Payer: Self-pay

## 2021-10-01 DIAGNOSIS — H1033 Unspecified acute conjunctivitis, bilateral: Secondary | ICD-10-CM | POA: Diagnosis not present

## 2021-10-01 DIAGNOSIS — I10 Essential (primary) hypertension: Secondary | ICD-10-CM | POA: Diagnosis not present

## 2021-10-01 DIAGNOSIS — H66001 Acute suppurative otitis media without spontaneous rupture of ear drum, right ear: Secondary | ICD-10-CM | POA: Diagnosis not present

## 2021-10-01 DIAGNOSIS — L299 Pruritus, unspecified: Secondary | ICD-10-CM | POA: Diagnosis not present

## 2021-10-01 DIAGNOSIS — H1012 Acute atopic conjunctivitis, left eye: Secondary | ICD-10-CM | POA: Diagnosis not present

## 2021-10-01 DIAGNOSIS — H66004 Acute suppurative otitis media without spontaneous rupture of ear drum, recurrent, right ear: Secondary | ICD-10-CM | POA: Insufficient documentation

## 2021-10-01 MED ORDER — OLOPATADINE HCL 0.1 % OP SOLN: 1.0000 [drp] | Freq: Two times a day (BID) | OPHTHALMIC | 0 refills | Status: AC

## 2021-10-01 MED ORDER — TETRACAINE HCL 0.5 % OP SOLN
1.0000 [drp] | Freq: Once | OPHTHALMIC | Status: AC
  Administered 2021-10-01: 1 [drp] via OPHTHALMIC
  Filled 2021-10-01: qty 4

## 2021-10-01 MED ORDER — EYE WASH OPHTH SOLN
1.0000 [drp] | OPHTHALMIC | Status: DC | PRN
  Administered 2021-10-01: 1 [drp] via OPHTHALMIC
  Filled 2021-10-01: qty 118

## 2021-10-01 MED ORDER — FLUORESCEIN SODIUM 1 MG OP STRP
1.0000 | ORAL_STRIP | Freq: Once | OPHTHALMIC | Status: AC
  Administered 2021-10-01: 1 via OPHTHALMIC
  Filled 2021-10-01: qty 1

## 2021-10-01 MED ORDER — CEFDINIR 300 MG PO CAPS: 300.0000 mg | ORAL_CAPSULE | Freq: Two times a day (BID) | ORAL | 0 refills | Status: DC

## 2021-10-01 NOTE — Discharge Instructions (Signed)
Continue taking your blood pressure medication daily as her blood pressure was 175/107 in triage.  Continue with allergy medication as needed for allergies.  A prescription for Pataday ophthalmic solution 1 drop to left eye twice a day is for allergies.  Omnicef 300 mg twice daily for 10 days was sent to the pharmacy for your right ear infection.  You may continue taking Tylenol or ibuprofen with this medication if needed for ear pain.  Follow-up with your primary care provider.  If not improving with the eyedrops for your left eye you should see Dr. Druscilla Brownie who is on-call for [facility].  You will need to call and make an appointment but let them know that you were seen in the emergency department.

## 2021-10-01 NOTE — ED Provider Notes (Signed)
[facility] Provider Note    None    (approximate)   History   Eye Pain and Otalgia   HPI  Sandra Brandt is a 56 y.o. female resents to the ED with complaint of right ear pain for several day and also left eye itching that began yesterday.  Patient denies any injury or foreign body sensation.  She reports that she does have allergies and takes medication for allergies.  No crusting or yellow exudate has been noted.  Patient is unaware of any visual changes.  Also blood pressure was elevated patient states that she has not taken her blood pressure medication in the last 2 days.  Patient has a history of hypertension, seasonal allergies, plantars fasciitis and GERD.  Blood pressure is elevated in the ED however patient has not taken her blood pressure medication last 2 days.   Physical Exam   Triage Vital Signs: ED Triage Vitals  Enc Vitals Group     BP 10/01/21 0815 (!) 175/107     Pulse Rate 10/01/21 0815 78     Resp 10/01/21 0815 20     Temp 10/01/21 0815 98.5 F (36.9 C)     Temp Source 10/01/21 0815 Oral     SpO2 10/01/21 0815 98 %     Weight 10/01/21 0814 257 lb (116.6 kg)     Height 10/01/21 0814 5\' 6"  (1.676 m)     Head Circumference --      Peak Flow --      Pain Score 10/01/21 0813 5     Pain Loc --      Pain Edu? --      Excl. in GC? --     Most recent vital signs: Vitals:   10/01/21 0815  BP: (!) 175/107  Pulse: 78  Resp: 20  Temp: 98.5 F (36.9 C)  SpO2: 98%     General: Awake, no distress.  Pleasant and cooperative. CV:  Good peripheral perfusion.  Heart regular rate and rhythm without murmur. Resp:  Normal effort.  Lungs are clear bilaterally. Abd:  No distention.  Other:  Left conjunctiva is mildly injected with clear drainage.  No yellow exudate and lashes are not involved.  Tetracaine was applied to the left eye.  No foreign body was noted.  Left eye upper lid was everted no foreign body present.  PERRLA, EOMI.   Fluorescein was negative for corneal abrasion.  Examination of the right ear is positive for mild erythema but no injection or fluid level noted.  EAC is clear.  Neck is supple without cervical lymphadenopathy.  ED Results / Procedures / Treatments   Labs (all labs ordered are listed, but only abnormal results are displayed) Labs Reviewed - No data to display   PROCEDURES:  Critical Care performed: No  Procedures   MEDICATIONS ORDERED IN ED: Medications  eye wash ((SODIUM/POTASSIUM/SOD CHLORIDE)) ophthalmic solution 1 drop (1 drop Right Eye Given by Other 10/01/21 0924)  tetracaine (PONTOCAINE) 0.5 % ophthalmic solution 1 drop (1 drop Right Eye Given by Other 10/01/21 0924)  fluorescein ophthalmic strip 1 strip (1 strip Left Eye Given by Other 10/01/21 0923)     IMPRESSION / MDM / ASSESSMENT AND PLAN / ED COURSE  I reviewed the triage vital signs and the nursing notes.                              Differential diagnosis  includes, but is not limited to, bacterial, viral or allergic conjunctivitis.  Foreign body left eye, corneal abrasion left eye.  Otitis media, serous otitis, cerumen impaction.  56 year old female presents to the ED with complaint of left teye redness and itching with a history of allergies and right ear pain for several days.  Patient has a history of seasonal allergies, reflux and hypertension.  Patient reports that she has not taken her blood pressure medication the last 2 days explaining why her blood pressure is elevated.  She is strongly encouraged to take her blood pressure medication which she has in her purse at this time.  Full exam of the left eye was negative for foreign body or corneal abrasion.  With clear drainage most likely is allergic conjunctivitis and a prescription for Pataday was sent to the pharmacy.  Right TM is erythematous.  Patient was made aware that most likely this is causing her ear pain.  A prescription for Omnicef 300 mg twice daily was  sent to the pharmacy and she may continue taking over-the-counter medication as needed for pain at home.  She is encouraged to follow-up with her PCP for her ear pain and if her left eye is not improving she is to follow-up with Dr. Druscilla Brownie who is on-call for [facility].      FINAL CLINICAL IMPRESSION(S) / ED DIAGNOSES   Final diagnoses:  Allergic conjunctivitis of left eye  Non-recurrent acute suppurative otitis media of right ear without spontaneous rupture of tympanic membrane     Rx / DC Orders   ED Discharge Orders          Ordered    cefdinir (OMNICEF) 300 MG capsule  2 times daily        10/01/21 0846    olopatadine (PATADAY) 0.1 % ophthalmic solution  2 times daily        10/01/21 0846             Note:  This document was prepared using Dragon voice recognition software and may include unintentional dictation errors.   Tommi Rumps, PA-C 10/01/21 1040    Arnaldo Natal, MD 10/01/21 [phone]

## 2021-10-01 NOTE — ED Triage Notes (Signed)
Pt reports yesterday started with eye redness and itching to her left eye, redness noted this am. Pt also reports some pain to her right ear for several days.

## 2021-10-03 DIAGNOSIS — M25571 Pain in right ankle and joints of right foot: Secondary | ICD-10-CM | POA: Diagnosis not present

## 2021-10-03 DIAGNOSIS — R2689 Other abnormalities of gait and mobility: Secondary | ICD-10-CM | POA: Diagnosis not present

## 2021-10-05 DIAGNOSIS — M25571 Pain in right ankle and joints of right foot: Secondary | ICD-10-CM | POA: Diagnosis not present

## 2021-10-05 DIAGNOSIS — R2689 Other abnormalities of gait and mobility: Secondary | ICD-10-CM | POA: Diagnosis not present

## 2021-10-10 DIAGNOSIS — R2689 Other abnormalities of gait and mobility: Secondary | ICD-10-CM | POA: Diagnosis not present

## 2021-10-10 DIAGNOSIS — M25571 Pain in right ankle and joints of right foot: Secondary | ICD-10-CM | POA: Diagnosis not present

## 2021-10-13 DIAGNOSIS — R2689 Other abnormalities of gait and mobility: Secondary | ICD-10-CM | POA: Diagnosis not present

## 2021-10-13 DIAGNOSIS — M25571 Pain in right ankle and joints of right foot: Secondary | ICD-10-CM | POA: Diagnosis not present

## 2021-10-17 DIAGNOSIS — M25571 Pain in right ankle and joints of right foot: Secondary | ICD-10-CM | POA: Diagnosis not present

## 2021-10-17 DIAGNOSIS — R2689 Other abnormalities of gait and mobility: Secondary | ICD-10-CM | POA: Diagnosis not present

## 2021-10-20 DIAGNOSIS — M25571 Pain in right ankle and joints of right foot: Secondary | ICD-10-CM | POA: Diagnosis not present

## 2021-10-20 DIAGNOSIS — R2689 Other abnormalities of gait and mobility: Secondary | ICD-10-CM | POA: Diagnosis not present

## 2021-10-24 DIAGNOSIS — R2689 Other abnormalities of gait and mobility: Secondary | ICD-10-CM | POA: Diagnosis not present

## 2021-10-24 DIAGNOSIS — M25571 Pain in right ankle and joints of right foot: Secondary | ICD-10-CM | POA: Diagnosis not present

## 2021-10-27 ENCOUNTER — Ambulatory Visit: Payer: BC Managed Care – PPO | Admitting: Podiatry

## 2021-10-27 ENCOUNTER — Encounter: Payer: Self-pay | Admitting: Podiatry

## 2021-10-27 ENCOUNTER — Other Ambulatory Visit: Payer: Self-pay

## 2021-10-27 ENCOUNTER — Encounter: Payer: Self-pay | Admitting: *Deleted

## 2021-10-27 DIAGNOSIS — M79671 Pain in right foot: Secondary | ICD-10-CM | POA: Diagnosis not present

## 2021-10-27 DIAGNOSIS — M778 Other enthesopathies, not elsewhere classified: Secondary | ICD-10-CM

## 2021-10-31 DIAGNOSIS — M25571 Pain in right ankle and joints of right foot: Secondary | ICD-10-CM | POA: Diagnosis not present

## 2021-10-31 DIAGNOSIS — R2689 Other abnormalities of gait and mobility: Secondary | ICD-10-CM | POA: Diagnosis not present

## 2021-11-06 MED ORDER — CYCLOBENZAPRINE HCL 10 MG PO TABS: 10.0000 mg | ORAL_TABLET | Freq: Three times a day (TID) | ORAL | 0 refills | Status: DC | PRN

## 2021-11-06 NOTE — Progress Notes (Signed)
° °  Subjective:  Patient presents today status post endoscopic plantar fasciotomy and exostosis of the left second toe. DOS: 03/09/2021.  Patient states that the left second toe is doing very well.  She continues to have pain throughout the midtarsal joint and ankle of the left foot.  Patient states that she has pain on a regular basis.  Currently she is not taking anything orally.  Last visit an MRI was ordered but this was denied.  Past Medical History:  Diagnosis Date   GERD (gastroesophageal reflux disease)    Hypertension    Plantar fasciitis of right foot 08/18/2020   Seasonal allergies    Trichomoniasis 08/2020      Objective/Physical Exam Neurovascular status intact.  Patient continues to have severe left foot and ankle pain.  The majority the pain appears to be throughout the midtarsal joint of the right foot as well as into the ankle joint.  Patient is very hypersensitive to touch.  She is very guarded.  Foot is warm.  Moderate edema noted.  Radiographic exam LT foot and ankle Normal osseous mineralization.  Joint spaces preserved.  Otherwise normal exam  Assessment: 1. s/p EPF RT.  Exostectomy LT second toe. DOS: 03/09/2021 2.  Capsulitis right foot and ankle 3.  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 Patient was evaluated. 2.  Patient continues to have chronic severe pain to the right foot in general.  Unrelated to the plantar fascia and second toe.  Pain out of proportion to the surgery that was performed there is some concern for possible CRPS development, although the foot is not cold or clammy, she does have severe generalized pain associated to the foot 3.  Prescription for Flexeril 10 mg 3 times daily 4.  Today were going to place a consult for neurology referral for second opinion regarding possible CRPS 5.  Continue gabapentin 100 mg 3 times daily 6.  Continue meloxicam 15 mg daily 7.  Today we will resubmit for MRI right foot.  Last MRI was denied by  insurance a few months prior 8.  Return to clinic in 4 weeks   [name], DPM [facility]  Dr. [name], DPM    [address] [place], [place] [address]                Office [phone]  Fax [phone]

## 2021-11-09 ENCOUNTER — Ambulatory Visit: Payer: BC Managed Care – PPO | Admitting: Cardiology

## 2021-11-09 ENCOUNTER — Other Ambulatory Visit: Payer: Self-pay

## 2021-11-09 ENCOUNTER — Encounter: Payer: Self-pay | Admitting: Cardiology

## 2021-11-09 VITALS — BP 140/82 | HR 58 | Ht 66.0 in | Wt 247.0 lb

## 2021-11-09 DIAGNOSIS — E7800 Pure hypercholesterolemia, unspecified: Secondary | ICD-10-CM | POA: Diagnosis not present

## 2021-11-09 DIAGNOSIS — I10 Essential (primary) hypertension: Secondary | ICD-10-CM

## 2021-11-09 DIAGNOSIS — Z6839 Body mass index (BMI) 39.0-39.9, adult: Secondary | ICD-10-CM | POA: Diagnosis not present

## 2021-11-09 MED ORDER — LOSARTAN POTASSIUM 50 MG PO TABS: 50.0000 mg | ORAL_TABLET | Freq: Every day | ORAL | 3 refills | Status: DC

## 2021-11-09 NOTE — Patient Instructions (Signed)
Medication Instructions:   Your physician has recommended you make the following change in your medication:    STOP taking your losartan-hydrochlorothiazide (HYZAAR) 50-12.5 MG tablet.  2.    START taking Losartan 50 MG once a day.   *If you need a refill on your cardiac medications before your next appointment, please call your pharmacy*   Lab Work: None ordered If you have labs (blood work) drawn today and your tests are completely normal, you will receive your results only by: [place] (if you have MyChart) OR A paper copy in the mail If you have any lab test that is abnormal or we need to change your treatment, we will call you to review the results.   Testing/Procedures: None ordered   Follow-Up: At [facility], you and your health needs are our priority.  As part of our continuing mission to provide you with exceptional heart care, we have created designated Provider Care Teams.  These Care Teams include your primary Cardiologist (physician) and Advanced Practice Providers (APPs -  Physician Assistants and Nurse Practitioners) who all work together to provide you with the care you need, when you need it.  We recommend signing up for the patient portal called "MyChart".  Sign up information is provided on this After Visit Summary.  MyChart is used to connect with patients for Virtual Visits (Telemedicine).  Patients are able to view lab/test results, encounter notes, upcoming appointments, etc.  Non-urgent messages can be sent to your provider as well.   To learn more about what you can do with MyChart, go to [email or web address].    Your next appointment:   6 week(s)  The format for your next appointment:   In Person  Provider:    ONLY WITH Kate Sable, MD    Other Instructions

## 2021-11-09 NOTE — Progress Notes (Signed)
Cardiology Office Note:    Date:  11/09/2021   ID:  Sandra Brandt, DOB 12-14-1965, MRN [id number]  PCP:  Patient, No Pcp Per (Inactive)  Cardiologist:  Kate Sable, MD  Electrophysiologist:  None   Referring MD: No ref. provider found   Chief Complaint  Patient presents with   Other    12 month follow up -- Patient c.o since taking the lisinopril- HCTZ gives her a headache. Meds reviewed verbally with patient.     History of Present Illness:    Sandra Brandt is a 56 y.o. female with a hx of GERD, hypertension, hyperlipidemia who presents for follow-up.  Patient being seen for hypertension.  She states doing okay, recently her foot surgery due to plantar fasciitis.  Started Flexeril which caused her to have a headache.  Currently takes losartan-HCTZ daily for BP control.  She states BP pill causes frequent urination.  Trying to eat healthy and lose some weight.  She otherwise feels well..    Prior notes Echo [id number] normal systolic and diastolic function, EF 65 to 70%. Coronary CTA 11/2019, calcium score of 0, no evidence of CAD.  Past Medical History:  Diagnosis Date   GERD (gastroesophageal reflux disease)    Hypertension    Plantar fasciitis of right foot 08/18/2020   Seasonal allergies    Trichomoniasis 08/2020    Past Surgical History:  Procedure Laterality Date   BUNIONECTOMY     FOOT SURGERY      Current Medications: Current Meds  Medication Sig   cefdinir (OMNICEF) 300 MG capsule Take 1 capsule (300 mg total) by mouth 2 (two) times daily.   cyclobenzaprine (FLEXERIL) 10 MG tablet Take 1 tablet (10 mg total) by mouth 3 (three) times daily as needed for muscle spasms.   fluticasone (FLONASE) 50 MCG/ACT nasal spray Place 2 sprays into both nostrils daily.   gabapentin (NEURONTIN) 100 MG capsule Take 1 capsule (100 mg total) by mouth 3 (three) times daily.   losartan (COZAAR) 50 MG tablet Take 1 tablet (50 mg total) by mouth daily.   meloxicam (MOBIC) 15 MG  tablet Take 1 tablet (15 mg total) by mouth daily.   Misc Natural Products (AIRBORNE ELDERBERRY) CHEW Chew by mouth.   olopatadine (PATADAY) 0.1 % ophthalmic solution Place 1 drop into the left eye 2 (two) times daily.   oxyCODONE-acetaminophen (PERCOCET) 5-325 MG tablet Take 1 tablet by mouth every 4 (four) hours as needed for severe pain.   [DISCONTINUED] losartan-hydrochlorothiazide (HYZAAR) 50-12.5 MG tablet TAKE 1/2 TABLET BY MOUTH EVERY DAY   Current Facility-Administered Medications for the 11/09/21 encounter (Office Visit) with Kate Sable, MD  Medication   betamethasone acetate-betamethasone sodium phosphate (CELESTONE) injection 3 mg     Allergies:   Elemental sulfur   Social History   Socioeconomic History   Marital status: Single    Spouse name: Not on file   Number of children: Not on file   Years of education: Not on file   Highest education level: Not on file  Occupational History   Not on file  Tobacco Use   Smoking status: Former    Packs/day: 6.00    Types: Cigarettes    Quit date: 12/05/2018    Years since quitting: 2.9   Smokeless tobacco: Never   Tobacco comments:    social smoker   Vaping Use   Vaping Use: Never used  Substance and Sexual Activity   Alcohol use: Yes   Drug use: Never  Sexual activity: Not Currently  Other Topics Concern   Not on file  Social History Narrative   Not on file   Social Determinants of Health   Financial Resource Strain: Not on file  Food Insecurity: Not on file  Transportation Needs: Not on file  Physical Activity: Not on file  Stress: Not on file  Social Connections: Not on file     Family History: The patient's family history includes Cancer in her mother; Diabetes in her brother; Heart disease (age of onset: 10) in her mother; Hyperlipidemia in her mother and sister; Hypertension in her brother, mother, and sister.  ROS:   Please see the history of present illness.     All other systems reviewed and  are negative.  EKGs/Labs/Other Studies Reviewed:    The following studies were reviewed today:   EKG:  EKG is  ordered today.  The ekg ordered today demonstrates sinus bradycardia  Recent Labs: No results found for requested labs within last 8760 hours.  Recent Lipid Panel    Component Value Date/Time   CHOL 189 07/07/2020 1123   TRIG 106 07/07/2020 1123   HDL 56 07/07/2020 1123   LDLCALC 114 (H) 07/07/2020 1123    Physical Exam:    VS:  BP 140/82 (BP Location: Right Arm, Patient Position: Sitting, Cuff Size: Normal)    Pulse (!) 58    Ht 5\' 6"  (1.676 m)    Wt 247 lb (112 kg)    SpO2 98%    BMI 39.87 kg/m     Wt Readings from Last 3 Encounters:  11/09/21 247 lb (112 kg)  10/01/21 257 lb (116.6 kg)  11/14/20 258 lb 6 oz (117.2 kg)     GEN:  Well nourished, well developed in no acute distress HEENT: Normal NECK: No JVD; No carotid bruits LYMPHATICS: No lymphadenopathy CARDIAC: RRR, 2/6 systolic murmur RUSB, rubs, gallops RESPIRATORY:  Clear to auscultation without rales, wheezing or rhonchi  ABDOMEN: Soft, non-tender, non-distended MUSCULOSKELETAL:  No edema; No deformity  SKIN: Warm and dry NEUROLOGIC:  Alert and oriented x 3 PSYCHIATRIC:  Normal affect   ASSESSMENT:    1. Primary hypertension   2. Pure hypercholesterolemia   3. BMI 39.0-39.9,adult     PLAN:    In order of problems listed above:  Hypertension, BP controlled.  Start losartan 50 mg daily, stop Hyzaar due to frequent urination.  Titrate losartan as needed for adequate BP control. Hyperlipidemia, not in statin benefit group.  Low-cholesterol diet advised. Obesity.  Patient congratulated on weight loss so far.  Encouraged to continue low-calorie diet, weight loss.  Follow-up in 6 weeks.  This note was generated in part or whole with voice recognition software. Voice recognition is usually quite accurate but there are transcription errors that can and very often do occur. I apologize for any  typographical errors that were not detected and corrected.  Medication Adjustments/Labs and Tests Ordered: Current medicines are reviewed at length with the patient today.  Concerns regarding medicines are outlined above.  Orders Placed This Encounter  Procedures   EKG 12-Lead   Meds ordered this encounter  Medications   losartan (COZAAR) 50 MG tablet    Sig: Take 1 tablet (50 mg total) by mouth daily.    Dispense:  30 tablet    Refill:  3    Patient Instructions  Medication Instructions:   Your physician has recommended you make the following change in your medication:    STOP taking your losartan-hydrochlorothiazide (  HYZAAR) 50-12.5 MG tablet.  2.    START taking Losartan 50 MG once a day.   *If you need a refill on your cardiac medications before your next appointment, please call your pharmacy*   Lab Work: None ordered If you have labs (blood work) drawn today and your tests are completely normal, you will receive your results only by: [place] (if you have MyChart) OR A paper copy in the mail If you have any lab test that is abnormal or we need to change your treatment, we will call you to review the results.   Testing/Procedures: None ordered   Follow-Up: At [facility], you and your health needs are our priority.  As part of our continuing mission to provide you with exceptional heart care, we have created designated Provider Care Teams.  These Care Teams include your primary Cardiologist (physician) and Advanced Practice Providers (APPs -  Physician Assistants and Nurse Practitioners) who all work together to provide you with the care you need, when you need it.  We recommend signing up for the patient portal called "MyChart".  Sign up information is provided on this After Visit Summary.  MyChart is used to connect with patients for Virtual Visits (Telemedicine).  Patients are able to view lab/test results, encounter notes, upcoming appointments, etc.   Non-urgent messages can be sent to your provider as well.   To learn more about what you can do with MyChart, go to [email or web address].    Your next appointment:   6 week(s)  The format for your next appointment:   In Person  Provider:    ONLY WITH Kate Sable, MD    Other Instructions     Signed, Kate Sable, MD  11/09/2021 10:50 AM    [place]

## 2021-11-13 ENCOUNTER — Ambulatory Visit: Payer: BC Managed Care – PPO | Admitting: Cardiology

## 2021-11-14 DIAGNOSIS — R2689 Other abnormalities of gait and mobility: Secondary | ICD-10-CM | POA: Diagnosis not present

## 2021-11-14 DIAGNOSIS — M25571 Pain in right ankle and joints of right foot: Secondary | ICD-10-CM | POA: Diagnosis not present

## 2021-11-21 DIAGNOSIS — R2689 Other abnormalities of gait and mobility: Secondary | ICD-10-CM | POA: Diagnosis not present

## 2021-11-21 DIAGNOSIS — M25571 Pain in right ankle and joints of right foot: Secondary | ICD-10-CM | POA: Diagnosis not present

## 2021-11-22 ENCOUNTER — Other Ambulatory Visit: Payer: Self-pay | Admitting: Podiatry

## 2021-11-22 ENCOUNTER — Other Ambulatory Visit: Payer: Self-pay

## 2021-11-22 ENCOUNTER — Ambulatory Visit
Admission: RE | Admit: 2021-11-22 | Discharge: 2021-11-22 | Disposition: A | Discharge status: Home / Self Care | Payer: BC Managed Care – PPO | Source: Ambulatory Visit | Attending: Podiatry | Admitting: Podiatry

## 2021-11-22 DIAGNOSIS — M79671 Pain in right foot: Secondary | ICD-10-CM | POA: Diagnosis not present

## 2021-11-22 DIAGNOSIS — S83411A Sprain of medial collateral ligament of right knee, initial encounter: Secondary | ICD-10-CM | POA: Diagnosis not present

## 2021-11-22 DIAGNOSIS — G90529 Complex regional pain syndrome I of unspecified lower limb: Secondary | ICD-10-CM

## 2021-11-22 DIAGNOSIS — M778 Other enthesopathies, not elsewhere classified: Secondary | ICD-10-CM

## 2021-11-22 NOTE — Progress Notes (Signed)
Concern for CRPS

## 2021-11-24 ENCOUNTER — Ambulatory Visit: Payer: BC Managed Care – PPO | Admitting: Podiatry

## 2021-11-24 ENCOUNTER — Other Ambulatory Visit: Payer: Self-pay

## 2021-11-24 DIAGNOSIS — M778 Other enthesopathies, not elsewhere classified: Secondary | ICD-10-CM | POA: Diagnosis not present

## 2021-11-24 DIAGNOSIS — M62571 Muscle wasting and atrophy, not elsewhere classified, right ankle and foot: Secondary | ICD-10-CM

## 2021-11-24 DIAGNOSIS — M79671 Pain in right foot: Secondary | ICD-10-CM | POA: Diagnosis not present

## 2021-11-24 NOTE — Progress Notes (Signed)
? ?  Subjective:  ?Patient presents today status post endoscopic plantar fasciotomy and exostosis of the left second toe. DOS: 03/09/2021.  Patient's symptoms of the same.  She continues to have pain through the midtarsal joint.  She recently had MRI of the right foot and presents to review the results and discuss further treatment options ? ?Past Medical History:  ?Diagnosis Date  ? GERD (gastroesophageal reflux disease)   ? Hypertension   ? Plantar fasciitis of right foot 08/18/2020  ? Seasonal allergies   ? Trichomoniasis 08/2020  ? ?Past Surgical History:  ?Procedure Laterality Date  ? BUNIONECTOMY    ? FOOT SURGERY    ? ?Allergies  ?Allergen Reactions  ? Elemental Sulfur Dermatitis  ? ? ? ?Objective/Physical Exam ?Neurovascular status intact.  Patient continues to have severe left foot and ankle pain.  There continues to be pain throughout the midtarsal joint of the right foot as well as into the ankle joint.  Patient is very hypersensitive to touch.  She is very guarded.  Foot is warm.  Moderate edema noted. ? 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 There is some overlap in the medial and ?lateral plantar nerve distributions. ?  ?No evidence of acute fracture. Susceptibility artifact along the ?surface of the fifth metatarsal head/neck suggesting the presence of ?a foreign body and or postsurgical change. Similar susceptibility ?artifact along the dorsal surface of the first metatarsal head/neck. ?  ?Evidence of a medial collateral ligament and plantar plate tear at ?the fifth MTP joint. ? ?Assessment: ?1. s/p EPF RT.  Exostectomy LT second toe. DOS: 03/09/2021 ?2.  Capsulitis right foot  ?3.  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 ? ?Plan of Care:  ?1. Patient was evaluated.  MRI reviewed ?2.  Patient just recently completed 2 months of physical therapy and it was discontinued because there was no improvement ?3.  Referral to neurology pending ?4.  Continue wearing good supportive shoes and sneakers.  Patient may be full weightbearing with full activity no restrictions as tolerated ?5.  Continue Flexeril 10 mg 3 times daily as needed ?6.  Continue gabapentin 100 mg 3 times daily.  Refill provided ?7.  Continue meloxicam 15 mg daily ?8.  Return to clinic after neurology appointment for follow-up treatment and evaluation ? ? ?[name], DPM ?[facility] ? ?Dr. [name], DPM  ?  ?[address]                                    ?[place], [place] [address]                ?Office [phone]  ?Fax [phone] ? ? ? ? ? ?

## 2021-11-28 DIAGNOSIS — H04123 Dry eye syndrome of bilateral lacrimal glands: Secondary | ICD-10-CM | POA: Diagnosis not present

## 2021-11-28 DIAGNOSIS — H1045 Other chronic allergic conjunctivitis: Secondary | ICD-10-CM | POA: Diagnosis not present

## 2021-12-11 ENCOUNTER — Telehealth: Payer: Self-pay | Admitting: *Deleted

## 2021-12-11 NOTE — Telephone Encounter (Signed)
I'm returning your call how can I help you?  "I'm calling about the referral to a Neurologist.  I still haven't heard anything.  My foot is getting worse.  I can hardly stand on it.  I have to work.  I don't know what to do."  I'll send a message to Dr. Logan Bores' and his assistant and see if they can check on the status of the referral.  I'll see if they can call you back on tomorrow.   ?

## 2021-12-11 NOTE — Telephone Encounter (Signed)
Referral was placed 11/22/2021.  Could you please follow-up?  Thanks, Dr. Logan Bores

## 2021-12-11 NOTE — Telephone Encounter (Signed)
"  Please give me a call back." ?

## 2021-12-14 ENCOUNTER — Telehealth: Payer: Self-pay | Admitting: *Deleted

## 2021-12-14 ENCOUNTER — Other Ambulatory Visit: Payer: Self-pay | Admitting: Podiatry

## 2021-12-14 DIAGNOSIS — M62571 Muscle wasting and atrophy, not elsewhere classified, right ankle and foot: Secondary | ICD-10-CM

## 2021-12-14 DIAGNOSIS — G90529 Complex regional pain syndrome I of unspecified lower limb: Secondary | ICD-10-CM

## 2021-12-14 NOTE — Telephone Encounter (Signed)
"  I called [facility].  They said they denied the referral.  They said they do not treat the condition that he referred me for.  They said you all should see it in the system that it was denied."  I will let Dr. Logan Bores know.  Then, we'll let you know about the next step.  "Okay, thank you." ?

## 2021-12-14 NOTE — Telephone Encounter (Signed)
I attempted to call [facility].  I left a message for a return call.   ?

## 2021-12-14 NOTE — Telephone Encounter (Signed)
Referral placed for [facility]. Please notify patient. Thanks, Dr. Logan Bores

## 2021-12-14 NOTE — Progress Notes (Signed)
Onset of possible CRPS surgical foot.  ?

## 2021-12-14 NOTE — Telephone Encounter (Signed)
"  The referral was denied.  We do not treat that diagnosis here."  I'll let Dr. Logan Bores know. ?

## 2021-12-20 NOTE — Telephone Encounter (Signed)
I'm calling to let you know we haven't forgot about you.  Dr. Logan Bores is trying to refer you to Pain Management.  We will let you know when we get it scheduled.  "Okay, thank you." ? ?(Dr. Logan Bores, who do you want to refer her to?  Dr. Al Corpus said that he doesn't know of anyone in this area.) ?

## 2021-12-21 ENCOUNTER — Ambulatory Visit (INDEPENDENT_AMBULATORY_CARE_PROVIDER_SITE_OTHER): Payer: BC Managed Care – PPO | Admitting: Cardiology

## 2021-12-21 ENCOUNTER — Encounter: Payer: Self-pay | Admitting: Cardiology

## 2021-12-21 VITALS — BP 140/80 | HR 65 | Ht 65.0 in | Wt 251.0 lb

## 2021-12-21 DIAGNOSIS — E7800 Pure hypercholesterolemia, unspecified: Secondary | ICD-10-CM

## 2021-12-21 DIAGNOSIS — I10 Essential (primary) hypertension: Secondary | ICD-10-CM

## 2021-12-21 NOTE — Progress Notes (Signed)
?Cardiology Office Note:   ? ?Date:  12/21/2021  ? ?ID:  Sandra CruzBelinda D Brandt, DOB 07/19/1966, MRN [id number] ? ?PCP:  Patient, No Pcp Per (Inactive)  ?Cardiologist:  Debbe OdeaBrian Agbor-Etang, MD  ?Electrophysiologist:  None  ? ?Referring MD: No ref. provider found  ? ?Chief Complaint  ?Patient presents with  ? Other  ?  6 week follow up -- Meds reviewed verbally with patient.   ? ? ?History of Present Illness:   ? ?Sandra Brandt is a 56 y.o. female with a hx of GERD, hypertension, hyperlipidemia who presents for follow-up. ? ?Patient being seen for hypertension and medication adjustment.  Was previously taking losartan dose HCTZ combo.  She complained of frequent urination and not continue due to her job.  HCTZ was stopped, losartan increased to 50 mg daily.  She does not check her BP frequently at home.  Symptoms of frequent urination have improved.  She is trying to eat healthy and lose weight.  Recently had planter fasciitis surgery, is in constant right foot pain. ? ? ? ?Prior notes ?Echo 10/2019 normal systolic and diastolic function, EF 65 to 70%. ?Coronary CTA 11/2019, calcium score of 0, no evidence of CAD. ? ?Past Medical History:  ?Diagnosis Date  ? GERD (gastroesophageal reflux disease)   ? Hypertension   ? Plantar fasciitis of right foot 08/18/2020  ? Seasonal allergies   ? Trichomoniasis 08/2020  ? ? ?Past Surgical History:  ?Procedure Laterality Date  ? BUNIONECTOMY    ? FOOT SURGERY    ? ? ?Current Medications: ?Current Meds  ?Medication Sig  ? cyclobenzaprine (FLEXERIL) 10 MG tablet Take 1 tablet (10 mg total) by mouth 3 (three) times daily as needed for muscle spasms.  ? fluticasone (FLONASE) 50 MCG/ACT nasal spray Place 2 sprays into both nostrils daily.  ? gabapentin (NEURONTIN) 100 MG capsule Take 1 capsule (100 mg total) by mouth 3 (three) times daily.  ? losartan (COZAAR) 50 MG tablet Take 1 tablet (50 mg total) by mouth daily.  ? meloxicam (MOBIC) 15 MG tablet Take 1 tablet (15 mg total) by mouth daily.  ?  Misc Natural Products (AIRBORNE ELDERBERRY) CHEW Chew by mouth.  ? olopatadine (PATADAY) 0.1 % ophthalmic solution Place 1 drop into the left eye 2 (two) times daily.  ? oxyCODONE-acetaminophen (PERCOCET) 5-325 MG tablet Take 1 tablet by mouth every 4 (four) hours as needed for severe pain.  ? ?Current Facility-Administered Medications for the 12/21/21 encounter (Office Visit) with Debbe OdeaAgbor-Etang, Alexyia Guarino, MD  ?Medication  ? betamethasone acetate-betamethasone sodium phosphate (CELESTONE) injection 3 mg  ?  ? ?Allergies:   Elemental sulfur  ? ?Social History  ? ?Socioeconomic History  ? Marital status: Single  ?  Spouse name: Not on file  ? Number of children: Not on file  ? Years of education: Not on file  ? Highest education level: Not on file  ?Occupational History  ? Not on file  ?Tobacco Use  ? Smoking status: Former  ?  Packs/day: 6.00  ?  Types: Cigarettes  ?  Quit date: 12/05/2018  ?  Years since quitting: 3.0  ? Smokeless tobacco: Never  ? Tobacco comments:  ?  social smoker   ?Vaping Use  ? Vaping Use: Never used  ?Substance and Sexual Activity  ? Alcohol use: Yes  ? Drug use: Never  ? Sexual activity: Not Currently  ?Other Topics Concern  ? Not on file  ?Social History Narrative  ? Not on file  ? ?Social  Determinants of Health  ? ?Financial Resource Strain: Not on file  ?Food Insecurity: Not on file  ?Transportation Needs: Not on file  ?Physical Activity: Not on file  ?Stress: Not on file  ?Social Connections: Not on file  ?  ? ?Family History: ?The patient's family history includes Cancer in her mother; Diabetes in her brother; Heart disease (age of onset: 49) in her mother; Hyperlipidemia in her mother and sister; Hypertension in her brother, mother, and sister. ? ?ROS:   ?Please see the history of present illness.    ? All other systems reviewed and are negative. ? ?EKGs/Labs/Other Studies Reviewed:   ? ?The following studies were reviewed today: ? ? ?EKG:  EKG not ordered today.   ?Recent Labs: ?No results  found for requested labs within last 8760 hours.  ?Recent Lipid Panel ?   ?Component Value Date/Time  ? CHOL 189 07/07/2020 1123  ? TRIG 106 07/07/2020 1123  ? HDL 56 07/07/2020 1123  ? LDLCALC 114 (H) 07/07/2020 1123  ? ? ?Physical Exam:   ? ?VS:  BP 140/80 (BP Location: Left Arm, Patient Position: Sitting, Cuff Size: Large)   Pulse 65   Ht 5\' 5"  (1.651 m)   Wt 251 lb (113.9 kg)   SpO2 98%   BMI 41.77 kg/m?    ? ?Wt Readings from Last 3 Encounters:  ?12/21/21 251 lb (113.9 kg)  ?11/09/21 247 lb (112 kg)  ?10/01/21 257 lb (116.6 kg)  ?  ? ?GEN:  Well nourished, well developed in no acute distress ?HEENT: Normal ?NECK: No JVD; No carotid bruits ?LYMPHATICS: No lymphadenopathy ?CARDIAC: RRR, 2/6 systolic murmur RUSB, rubs, gallops ?RESPIRATORY:  Clear to auscultation without rales, wheezing or rhonchi  ?ABDOMEN: Soft, non-tender, non-distended ?MUSCULOSKELETAL:  No edema; right foot slightly tender to palpation ?SKIN: Warm and dry ?NEUROLOGIC:  Alert and oriented x 3 ?PSYCHIATRIC:  Normal affect  ? ?ASSESSMENT:   ? ?1. Primary hypertension   ?2. Pure hypercholesterolemia   ?3. Morbid obesity (HCC)   ? ?PLAN:   ? ?In order of problems listed above: ? ?Hypertension, BP elevated with systolic 140.  Component of chronic pain likely contributing.  Continue losartan 50 mg daily, low-salt diet advised.  Consider titration after adequate pain management if BP stays elevated. ?Hyperlipidemia, not in statin benefit group.  Low-cholesterol diet advised. ?Obesity.  Low-calorie diet, weight loss advised.  Wegovy recommended, patient declined. ? ?Follow-up in 5 months ? ? ?Medication Adjustments/Labs and Tests Ordered: ?Current medicines are reviewed at length with the patient today.  Concerns regarding medicines are outlined above.  ?No orders of the defined types were placed in this encounter. ? ?No orders of the defined types were placed in this encounter. ? ? ?Patient Instructions  ?Medication Instructions:  ?No changes at  this time.  ? ?*If you need a refill on your cardiac medications before your next appointment, please call your pharmacy* ? ? ?Lab Work: ?None ? ?If you have labs (blood work) drawn today and your tests are completely normal, you will receive your results only by: ?MyChart Message (if you have MyChart) OR ?A paper copy in the mail ?If you have any lab test that is abnormal or we need to change your treatment, we will call you to review the results. ? ? ?Testing/Procedures: ?None ? ? ?Follow-Up: ?At [facility], you and your health needs are our priority.  As part of our continuing mission to provide you with exceptional heart care, we have created designated Provider  Care Teams.  These Care Teams include your primary Cardiologist (physician) and Advanced Practice Providers (APPs -  Physician Assistants and Nurse Practitioners) who all work together to provide you with the care you need, when you need it. ? ? ?Your next appointment:   ?5 month(s) ? ?The format for your next appointment:   ?In Person ? ?Provider:   ?Debbe Odea, MD  ? ?Signed, ?Debbe Odea, MD  ?12/21/2021 12:03 PM    ?[facility] ?

## 2021-12-21 NOTE — Telephone Encounter (Signed)
Order placed 12/14/2021 for pain management at [facility] in [place].  Thanks, Dr. Logan Bores

## 2021-12-21 NOTE — Patient Instructions (Signed)
Medication Instructions:  ?No changes at this time.  ? ?*If you need a refill on your cardiac medications before your next appointment, please call your pharmacy* ? ? ?Lab Work: ?None ? ?If you have labs (blood work) drawn today and your tests are completely normal, you will receive your results only by: ?MyChart Message (if you have MyChart) OR ?A paper copy in the mail ?If you have any lab test that is abnormal or we need to change your treatment, we will call you to review the results. ? ? ?Testing/Procedures: ?None ? ? ?Follow-Up: ?At [facility], you and your health needs are our priority.  As part of our continuing mission to provide you with exceptional heart care, we have created designated Provider Care Teams.  These Care Teams include your primary Cardiologist (physician) and Advanced Practice Providers (APPs -  Physician Assistants and Nurse Practitioners) who all work together to provide you with the care you need, when you need it. ? ? ?Your next appointment:   ?5 month(s) ? ?The format for your next appointment:   ?In Person ? ?Provider:   ?Debbe Odea, MD ?

## 2021-12-22 ENCOUNTER — Other Ambulatory Visit: Payer: Self-pay

## 2021-12-22 ENCOUNTER — Emergency Department
Admission: EM | Admit: 2021-12-22 | Discharge: 2021-12-22 | Disposition: A | Discharge status: Home / Self Care | Payer: BC Managed Care – PPO | Attending: Emergency Medicine | Admitting: Emergency Medicine

## 2021-12-22 DIAGNOSIS — I10 Essential (primary) hypertension: Secondary | ICD-10-CM | POA: Diagnosis not present

## 2021-12-22 DIAGNOSIS — K1370 Unspecified lesions of oral mucosa: Secondary | ICD-10-CM | POA: Diagnosis not present

## 2021-12-22 DIAGNOSIS — K0510 Chronic gingivitis, plaque induced: Secondary | ICD-10-CM | POA: Diagnosis not present

## 2021-12-22 DIAGNOSIS — K0500 Acute gingivitis, plaque induced: Secondary | ICD-10-CM | POA: Diagnosis not present

## 2021-12-22 MED ORDER — NYSTATIN 100000 UNIT/ML MT SUSP: 5.0000 mL | Freq: Three times a day (TID) | OROMUCOSAL | 0 refills | Status: AC

## 2021-12-22 NOTE — ED Triage Notes (Signed)
Pt c/o tender "bump" on the inside of her upper lip for the past week. ?

## 2021-12-22 NOTE — Discharge Instructions (Signed)
You have some inflammation of the mucosa of your upper and lower lip.  Does not look like a cold sore.  Please try the Magic mouthwash and you can also try over-the-counter Orajel to help with the pain.  Please follow-up with your dentist. ?

## 2021-12-22 NOTE — ED Provider Notes (Signed)
? ?[facility] ?Provider Note ? ? ? Event Date/Time  ? First MD Initiated Contact with Patient 12/22/21 1315   ?  (approximate) ? ? ?History  ? ?Mouth Lesions ? ? ?HPI ? ?Sandra Brandt is a 56 y.o. female with past medical history of hypertension and GERD who presents with inflammation and pain of her gums.  For about 5 days patient has noticed pain of the Mucosal surface of the upper and lower lip.  Denies any new exposure.  No pain or lesions in the back of her mouth.  She has a partial dental implant that has been there for several years.  Denies fevers chills.  Has a dentist appointment later this month. ?  ? ?Past Medical History:  ?Diagnosis Date  ? GERD (gastroesophageal reflux disease)   ? Hypertension   ? Plantar fasciitis of right foot 08/18/2020  ? Seasonal allergies   ? Trichomoniasis 08/2020  ? ? ?Patient Active Problem List  ? Diagnosis Date Noted  ? Plantar fasciitis of right foot 08/18/2020  ? ? ? ?Physical Exam  ?Triage Vital Signs: ?ED Triage Vitals [12/22/21 1251]  ?Enc Vitals Group  ?   BP (!) 210/96  ?   Pulse Rate (!) 58  ?   Resp 16  ?   Temp 98 ?F (36.7 ?C)  ?   Temp Source Oral  ?   SpO2 99 %  ?   Weight   ?   Height   ?   Head Circumference   ?   Peak Flow   ?   Pain Score   ?   Pain Loc   ?   Pain Edu?   ?   Excl. in GC?   ? ? ?Most recent vital signs: ?Vitals:  ? 12/22/21 1251  ?BP: (!) 210/96  ?Pulse: (!) 58  ?Resp: 16  ?Temp: 98 ?F (36.7 ?C)  ?SpO2: 99%  ? ? ? ?General: Awake, no distress.  ?CV:  Good peripheral perfusion.  ?Resp:  Normal effort.  ?Abd:  No distention.  ?Neuro:             Awake, Alert, Oriented x 3  ?Other:   ?Nonspecific irritation of the mucosal surface of the upper and lower lip, no lesions or inflammation of the gingiva, no inflammation of the buccal mucosa or posterior oropharynx  ? ? ?ED Results / Procedures / Treatments  ?Labs ?(all labs ordered are listed, but only abnormal results are displayed) ?Labs Reviewed - No data to  display ? ? ?EKG ? ? ? ? ?RADIOLOGY ? ? ? ?PROCEDURES: ? ?Critical Care performed: No ? ?Procedures ? ? ? ? ?MEDICATIONS ORDERED IN ED: ?Medications - No data to display ? ? ?IMPRESSION / MDM / ASSESSMENT AND PLAN / ED COURSE  ?I reviewed the triage vital signs and the nursing notes. ?             ?               ? ?Differential diagnosis includes, but is not limited to, mechanical irritation from dental implant, oral lichen planus less likely HSV, precancerous lesion ? ?Patient is a 56 year old female presenting with irritation of the mucosal side of the upper and lower lips.  No gingival lesions and no lesions to the buccal mucosa or posterior oropharynx.  Does not have any obvious ulceration to suggest HSV or aphthous ulcers, there is however nonspecific irritation in these regions.  She does have  a dental implant that does align with the areas of irritation so I question whether there is mechanical irritation however patient has had the implant for quite some time and denies any change in the orientation or any sensation of mechanical rubbing.  Overall unclear what is causing her symptoms.  Prescribed Magic mouthwash for supportive care and recommended over-the-counter Orajel for pain.  Recommended dental follow-up. ? ?  ? ? ?FINAL CLINICAL IMPRESSION(S) / ED DIAGNOSES  ? ?Final diagnoses:  ?Gum inflammation  ? ? ? ?Rx / DC Orders  ? ?ED Discharge Orders   ? ?      Ordered  ?  magic mouthwash (nystatin, lidocaine, diphenhydrAMINE, alum & mag hydroxide) suspension  3 times daily       ? 12/22/21 1348  ? ?  ?  ? ?  ? ? ? ?Note:  This document was prepared using Dragon voice recognition software and may include unintentional dictation errors. ?  ?Georga Hacking, MD ?12/22/21 1355 ? ?

## 2022-01-03 ENCOUNTER — Encounter: Payer: Self-pay | Admitting: *Deleted

## 2022-01-03 ENCOUNTER — Telehealth: Payer: Self-pay | Admitting: *Deleted

## 2022-01-03 NOTE — Telephone Encounter (Signed)
Sandra Brandt still hasn't received a call from [facility] to schedule her an appointment.  I faxed the order to them. ?

## 2022-01-03 NOTE — Telephone Encounter (Signed)
"  My foot is killing me.  I had to stay out of work a couple of days.  It burns.  Sometime it feels like ice cold water is running on it.  Then the pain starts after that sensation.  I need an updated note for my job.  I can't be out of work, I have to work.  I don't know what else to do."  I'll write the note for you.  "Can I send my sister to pick it up?"  Yes, what is her name?  "Her name is Euleta Belson."  She'll need to show her ID to pick it up.  "I'll let her know.  Please seal the letter, I don't like everybody knowing my business." ? ?I wrote the letter as requested. ?

## 2022-02-16 NOTE — Telephone Encounter (Signed)
I am returning your call.  I apologize for the delayed response.  I have been out of the office.  How can I help you?  "I need to come in to see him or see if he can give me some medication.  My foot is killing me.  The left one is starting to hurt me now on the top of my foot.  I can hardly bend it."  I can schedule you an appointment with Dr. Logan Bores for Tuesday at 10 am.  "I'll be there."  [facility] has been trying to call you to schedule you an appointment for pain management.  The have called several times in April.  The last call was on Jan 18, 2022.  "I have been sleep and been missing my calls.  I try to call people back but I get a sound like a fax machine.  I can't get the voicemail messages for my phone."  Call them at this number, [phone] to get your appointment scheduled for pain management.

## 2022-02-20 ENCOUNTER — Ambulatory Visit: Payer: BC Managed Care – PPO | Admitting: Podiatry

## 2022-02-20 ENCOUNTER — Encounter: Payer: Self-pay | Admitting: Podiatry

## 2022-02-20 DIAGNOSIS — M778 Other enthesopathies, not elsewhere classified: Secondary | ICD-10-CM | POA: Diagnosis not present

## 2022-02-20 DIAGNOSIS — G90529 Complex regional pain syndrome I of unspecified lower limb: Secondary | ICD-10-CM

## 2022-02-20 MED ORDER — MELOXICAM 15 MG PO TABS: 15.0000 mg | ORAL_TABLET | Freq: Every day | ORAL | 1 refills | Status: DC

## 2022-02-20 MED ORDER — BETAMETHASONE SOD PHOS & ACET 6 (3-3) MG/ML IJ SUSP: 3.0000 mg | Freq: Once | INTRAMUSCULAR | Status: DC

## 2022-02-20 NOTE — Progress Notes (Signed)
   Subjective:  Patient presents today for follow-up evaluation of chronic severe bilateral foot pain.  Patient states that since last visit the pain has only increased.  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  Presenting for further treatment and evaluation  Past Medical History:  Diagnosis Date   GERD (gastroesophageal reflux disease)    Hypertension    Plantar fasciitis of right foot 08/18/2020   Seasonal allergies    Trichomoniasis 08/2020   Past Surgical History:  Procedure Laterality Date   BUNIONECTOMY     FOOT SURGERY     Allergies  Allergen Reactions   Elemental Sulfur Dermatitis   Objective/Physical Exam Neurovascular status intact.  Patient continues to have severe left foot and ankle pain.  Today there is pain throughout the midtarsal joint of the bilateral feet as well as the ankle joint..  Patient is very hypersensitive to touch.  She is very guarded.  Foot is warm.  Moderate edema noted.  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 There is some overlap in the medial and lateral plantar nerve distributions.   No evidence of acute fracture. Susceptibility artifact along the surface of the fifth metatarsal head/neck suggesting the presence of a foreign body and or postsurgical change. Similar susceptibility artifact along the dorsal surface of the first metatarsal head/neck.   Evidence of a medial collateral ligament and plantar plate tear at the fifth MTP joint.  Assessment: 1. s/p EPF RT.  Exostectomy LT second toe. DOS: 03/09/2021 2.  Capsulitis bilateral midfoot 3.  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 Patient was evaluated.  Patient  has tried physical therapy for about 2 months with no improvement. 2.  Injection of 0.5 cc Celestone Soluspan injected into the bilateral midtarsal joint 3.  Referral to neurology pending 4.  Continue wearing good supportive shoes and sneakers.  Patient may be full weightbearing with full activity no restrictions as tolerated 5.  Continue Flexeril 10 mg 3 times daily as needed 6.  Continue gabapentin 100 mg 3 times daily.  7.  Continue meloxicam 15 mg daily.  Refill provided 8.  Due to the patient's progressive pain now to the bilateral feet, I did place an order for pain management to evaluate for second opinion 9.  Also to rule out any circulation pathology or ischemia I did order ABIs bilateral 10.  Return to clinic 4 weeks  [name], DPM [facility]  Dr. [name], DPM    [address] [place], [place] [address]                Office [phone]  Fax [phone]

## 2022-02-21 ENCOUNTER — Telehealth: Payer: Self-pay | Admitting: Podiatry

## 2022-02-21 NOTE — Telephone Encounter (Signed)
meloxicam (MOBIC) 15 MG tablet [[id number]   betamethasone acetate-betamethasone sodium phosphate (CELESTONE) injection 3 mg   [[id number]    She call and said that [facility] didn't get the medication  and ask if you can resend it please        I did look it up you did send but not sure why [facility]

## 2022-02-22 ENCOUNTER — Other Ambulatory Visit: Payer: Self-pay | Admitting: Podiatry

## 2022-02-22 MED ORDER — MELOXICAM 15 MG PO TABS: 15.0000 mg | ORAL_TABLET | Freq: Every day | ORAL | 1 refills | Status: DC

## 2022-02-22 NOTE — Telephone Encounter (Signed)
Resent. -Dr. Logan Bores

## 2022-02-23 ENCOUNTER — Telehealth: Payer: Self-pay | Admitting: Podiatry

## 2022-02-23 ENCOUNTER — Other Ambulatory Visit: Payer: Self-pay | Admitting: Podiatry

## 2022-02-23 MED ORDER — METHYLPREDNISOLONE 4 MG PO TBPK: ORAL_TABLET | ORAL | 0 refills | Status: DC

## 2022-02-23 NOTE — Telephone Encounter (Signed)
Pt called and is currently at pharmacy. She states that she was told that there should be two medications that were to be sent to pharmacy - [facility] [place] - They only have the meloxicam but she thought that it was mentioned that she would also get a steroid pack. I advised that per the note, Dr Logan Bores wanted her to continue with the Flexeril and Gabapentin but she states she dont have any of these medications. She says that she will go ahead and pick up meloxicam while she is there now and wait for a call on any of the other medications.  Please advise.

## 2022-02-23 NOTE — Telephone Encounter (Signed)
I may have discussed a Medrol Dosepak with her but forgot to put it in my note.  I went ahead and sent in a prescription for Medrol Dosepak.  Thanks, Dr. Logan Bores

## 2022-02-26 NOTE — Telephone Encounter (Signed)
Left message for patient on voicemail to inform her of medication called in

## 2022-03-07 ENCOUNTER — Other Ambulatory Visit: Payer: Self-pay

## 2022-03-07 MED ORDER — LOSARTAN POTASSIUM 50 MG PO TABS: 50.0000 mg | ORAL_TABLET | Freq: Every day | ORAL | 3 refills | Status: DC

## 2022-03-23 ENCOUNTER — Ambulatory Visit: Payer: BC Managed Care – PPO | Admitting: Podiatry

## 2022-04-20 ENCOUNTER — Ambulatory Visit: Payer: BC Managed Care – PPO | Admitting: Podiatry

## 2022-04-27 ENCOUNTER — Ambulatory Visit: Payer: BC Managed Care – PPO | Admitting: Podiatry

## 2022-04-27 DIAGNOSIS — M7751 Other enthesopathy of right foot: Secondary | ICD-10-CM

## 2022-04-27 MED ORDER — MELOXICAM 15 MG PO TABS: 15.0000 mg | ORAL_TABLET | Freq: Every day | ORAL | 1 refills | Status: DC

## 2022-04-27 MED ORDER — BETAMETHASONE SOD PHOS & ACET 6 (3-3) MG/ML IJ SUSP
3.0000 mg | Freq: Once | INTRAMUSCULAR | Status: AC
  Administered 2022-04-27: 3 mg via INTRA_ARTICULAR

## 2022-04-27 NOTE — Progress Notes (Signed)
   Chief Complaint  Patient presents with   Follow-up    Patient states that she is here for follow-up for 1 month.     HPI: 56 y.o. female presenting today for follow-up evaluation of bilateral foot pain.  Patient states that she is actually feeling significantly better.  Overall improvement.  She has been taking the meloxicam as instructed.  She presents for further treatment and evaluation  Past Medical History:  Diagnosis Date   GERD (gastroesophageal reflux disease)    Hypertension    Plantar fasciitis of right foot 08/18/2020   Seasonal allergies    Trichomoniasis 08/2020    Past Surgical History:  Procedure Laterality Date   BUNIONECTOMY     FOOT SURGERY      Allergies  Allergen Reactions   Elemental Sulfur Dermatitis     Physical Exam: General: The patient is alert and oriented x3 in no acute distress.  Dermatology: Skin is warm, dry and supple bilateral lower extremities. Negative for open lesions or macerations.  Vascular: Palpable pedal pulses bilaterally. Capillary refill within normal limits.  Negative for any significant edema or erythema  Neurological: Light touch and protective threshold grossly intact  Musculoskeletal Exam: No pedal deformities noted.  Significant tenderness to palpation along the anterior aspect of the right ankle and forefoot  Assessment: 1.  Capsulitis right ankle 2.  PSxHx EPF RT. 02/2021 3.  Generalized foot pain bilateral   Plan of Care:  1. Patient evaluated. X-Rays reviewed.  2.  Injection of 0.5 cc Celestone Soluspan injected in the right ankle joint 3.  Continue meloxicam 15 mg daily.  Refill prescription provided 4.  Patient states that she is significantly improved.  She has not been contacted by pain management but she would rather not go. 5.  Continue wearing good supportive shoes and sneakers 6.  Return to clinic as needed      [name], DPM [facility]  Dr. [name], DPM    [address] [place], [place] [address]                Office [phone]  Fax [phone]

## 2022-05-04 ENCOUNTER — Encounter: Payer: Self-pay | Admitting: Emergency Medicine

## 2022-05-04 ENCOUNTER — Emergency Department
Admission: EM | Admit: 2022-05-04 | Discharge: 2022-05-04 | Disposition: A | Discharge status: Home / Self Care | Payer: BC Managed Care – PPO | Attending: Emergency Medicine | Admitting: Emergency Medicine

## 2022-05-04 ENCOUNTER — Other Ambulatory Visit: Payer: Self-pay

## 2022-05-04 DIAGNOSIS — H6993 Unspecified Eustachian tube disorder, bilateral: Secondary | ICD-10-CM | POA: Diagnosis not present

## 2022-05-04 DIAGNOSIS — X58XXXA Exposure to other specified factors, initial encounter: Secondary | ICD-10-CM | POA: Insufficient documentation

## 2022-05-04 DIAGNOSIS — I10 Essential (primary) hypertension: Secondary | ICD-10-CM | POA: Insufficient documentation

## 2022-05-04 DIAGNOSIS — M722 Plantar fascial fibromatosis: Secondary | ICD-10-CM

## 2022-05-04 DIAGNOSIS — H6981 Other specified disorders of Eustachian tube, right ear: Secondary | ICD-10-CM

## 2022-05-04 DIAGNOSIS — S46911A Strain of unspecified muscle, fascia and tendon at shoulder and upper arm level, right arm, initial encounter: Secondary | ICD-10-CM | POA: Insufficient documentation

## 2022-05-04 DIAGNOSIS — H6991 Unspecified Eustachian tube disorder, right ear: Secondary | ICD-10-CM

## 2022-05-04 DIAGNOSIS — S46919A Strain of unspecified muscle, fascia and tendon at shoulder and upper arm level, unspecified arm, initial encounter: Secondary | ICD-10-CM

## 2022-05-04 DIAGNOSIS — S46912A Strain of unspecified muscle, fascia and tendon at shoulder and upper arm level, left arm, initial encounter: Secondary | ICD-10-CM | POA: Diagnosis not present

## 2022-05-04 DIAGNOSIS — S4991XA Unspecified injury of right shoulder and upper arm, initial encounter: Secondary | ICD-10-CM | POA: Diagnosis not present

## 2022-05-04 MED ORDER — PREDNISONE 10 MG PO TABS: 30.0000 mg | ORAL_TABLET | Freq: Every day | ORAL | 0 refills | Status: DC

## 2022-05-04 MED ORDER — MOMETASONE FUROATE 50 MCG/ACT NA SUSP: 2.0000 | Freq: Every day | NASAL | 2 refills | Status: AC

## 2022-05-04 MED ORDER — BACLOFEN 10 MG PO TABS: 10.0000 mg | ORAL_TABLET | Freq: Three times a day (TID) | ORAL | 0 refills | Status: AC

## 2022-05-04 NOTE — ED Triage Notes (Signed)
Pt presents to ED with R ear pain, pain started 15 years ago. Pt also states bilateral shoulder pain, pt denies injury or trauma. Pt also has c/o of L heel pain. No injury or trauma noted for any of these complaints.

## 2022-05-04 NOTE — Discharge Instructions (Signed)
Follow up with dr Willeen Cass Follow up with dr patel Take the medication as prescribed

## 2022-05-04 NOTE — ED Provider Notes (Signed)
[facility] Provider Note    Event Date/Time   First MD Initiated Contact with Patient 05/04/22 [phone]     (approximate)   History   Otalgia and Shoulder Pain   HPI  Sandra Brandt is a 56 y.o. female with history of hypertension and seasonal allergies presents emergency department with multiple minor medical complaints.  Patient is complaining of left heel pain, right ear pain, and bilateral shoulder pain which feels like muscle strain as she has to wipe off the white boards frequently.  No fever or chills.  No known injury.      Physical Exam   Triage Vital Signs: ED Triage Vitals  Enc Vitals Group     BP 05/04/22 0824 (!) 166/94     Pulse Rate 05/04/22 0824 69     Resp 05/04/22 0824 18     Temp 05/04/22 0824 98 F (36.7 C)     Temp Source 05/04/22 0824 Oral     SpO2 05/04/22 0824 100 %     Weight 05/04/22 0832 251 lb 1.7 oz (113.9 kg)     Height 05/04/22 0832 5\' 5"  (1.651 m)     Head Circumference --      Peak Flow --      Pain Score 05/04/22 0824 9     Pain Loc --      Pain Edu? --      Excl. in GC? --     Most recent vital signs: Vitals:   05/04/22 0824  BP: (!) 166/94  Pulse: 69  Resp: 18  Temp: 98 F (36.7 C)  SpO2: 100%     General: Awake, no distress.   CV:  Good peripheral perfusion. regular rate and  rhythm Resp:  Normal effort.  Abd:  No distention.   Other:  Shoulders tender bilaterally across the pec muscles and shoulder joints, right TM is dull with fluid, no infection is noted, left heel is tender to palpation   ED Results / Procedures / Treatments   Labs (all labs ordered are listed, but only abnormal results are displayed) Labs Reviewed - No data to display   EKG     RADIOLOGY     PROCEDURES:   Procedures   MEDICATIONS ORDERED IN ED: Medications - No data to display   IMPRESSION / MDM / ASSESSMENT AND PLAN / ED COURSE  I reviewed the triage vital signs and the nursing notes.                               Differential diagnosis includes, but is not limited to, station tube dysfunction, acute otitis media, shoulder strain, plantar fasciitis  Patient's presentation is most consistent with acute, uncomplicated illness.   Patient appears to be very well.  All of her complaints are minor.  She can follow-up with Dr. 05/06/22 for her chronic ear problems.  Follow-up with orthopedics for shoulder problems.  Follow-up with her foot surgeon for the plantar fasciitis.  She was started on prednisone 30 mg daily for 3 days, Nasonex nasal spray.  She is apply ice to all areas that hurt.  She was discharged stable condition      FINAL CLINICAL IMPRESSION(S) / ED DIAGNOSES   Final diagnoses:  Dysfunction of Eustachian tube, right  Strain of shoulder, unspecified laterality, initial encounter  Plantar fasciitis     Rx / DC Orders   ED Discharge Orders  Ordered    predniSONE (DELTASONE) 10 MG tablet  Daily with breakfast        05/04/22 0856    mometasone (NASONEX) 50 MCG/ACT nasal spray  Daily        05/04/22 0856    baclofen (LIORESAL) 10 MG tablet  3 times daily        05/04/22 0856             Note:  This document was prepared using Dragon voice recognition software and may include unintentional dictation errors.    Faythe Ghee, PA-C 05/04/22 1610    Chesley Noon, MD 05/04/22 [phone]

## 2022-05-22 ENCOUNTER — Encounter: Payer: Self-pay | Admitting: Cardiology

## 2022-05-22 ENCOUNTER — Ambulatory Visit: Payer: BC Managed Care – PPO | Attending: Cardiology | Admitting: Cardiology

## 2022-05-22 VITALS — BP 142/90 | HR 64 | Ht 66.0 in | Wt 258.2 lb

## 2022-05-22 DIAGNOSIS — E7800 Pure hypercholesterolemia, unspecified: Secondary | ICD-10-CM

## 2022-05-22 DIAGNOSIS — I10 Essential (primary) hypertension: Secondary | ICD-10-CM | POA: Diagnosis not present

## 2022-05-22 MED ORDER — LOSARTAN POTASSIUM 50 MG PO TABS: 100.0000 mg | ORAL_TABLET | Freq: Every day | ORAL | 3 refills | Status: DC

## 2022-05-22 NOTE — Patient Instructions (Signed)
Medication Instructions:   Your physician has recommended you make the following change in your medication:    INCREASE your Losartan to 100 MG once a day.  *If you need a refill on your cardiac medications before your next appointment, please call your pharmacy*   Lab Work:  Your physician recommends that you return for lab work (BMP) in: 1 week  - Please go to the [facility]. You will check in at the front desk to the right as you walk into the atrium. Valet Parking is offered if needed. - No appointment needed. You may go any day between 7 am and 6 pm.    Follow-Up: At [facility], you and your health needs are our priority.  As part of our continuing mission to provide you with exceptional heart care, we have created designated Provider Care Teams.  These Care Teams include your primary Cardiologist (physician) and Advanced Practice Providers (APPs -  Physician Assistants and Nurse Practitioners) who all work together to provide you with the care you need, when you need it.  We recommend signing up for the patient portal called "MyChart".  Sign up information is provided on this After Visit Summary.  MyChart is used to connect with patients for Virtual Visits (Telemedicine).  Patients are able to view lab/test results, encounter notes, upcoming appointments, etc.  Non-urgent messages can be sent to your provider as well.   To learn more about what you can do with MyChart, go to [email or web address].    Your next appointment:   2-3 month(s)  The format for your next appointment:   In Person  Provider:   Debbe Odea, MD    Other Instructions   Important Information About Sugar

## 2022-05-22 NOTE — Progress Notes (Signed)
Cardiology Office Note:    Date:  05/22/2022   ID:  Sandra Brandt, DOB Nov 30, 1965, MRN [id number]  PCP:  Patient, No Pcp Per  Cardiologist:  Kate Sable, MD  Electrophysiologist:  None   Referring MD: No ref. provider found   Chief Complaint  Patient presents with   Follow-up    5 month follow up(12-21-2021), HTN    History of Present Illness:    Sandra Brandt is a 56 y.o. female with a hx of GERD, hypertension, hyperlipidemia who presents for follow-up.  Being seen for hypertension and medication management.  Losartan previously increased to 50 mg daily, BP still elevated.  Compliant with medications as prescribed.  Endorses eating salty foods sometimes.  Otherwise feels okay, denies chest pain or shortness of breath.   Prior notes Echo [id number] normal systolic and diastolic function, EF 65 to 70%. Coronary CTA 11/2019, calcium score of 0, no evidence of CAD.  Past Medical History:  Diagnosis Date   GERD (gastroesophageal reflux disease)    Hypertension    Plantar fasciitis of right foot 08/18/2020   Seasonal allergies    Trichomoniasis 08/2020    Past Surgical History:  Procedure Laterality Date   BUNIONECTOMY     FOOT SURGERY      Current Medications: Current Meds  Medication Sig   meloxicam (MOBIC) 15 MG tablet Take 1 tablet (15 mg total) by mouth daily.   Misc Natural Products (AIRBORNE ELDERBERRY) CHEW Chew by mouth.   mometasone (NASONEX) 50 MCG/ACT nasal spray Place 2 sprays into the nose daily.   olopatadine (PATADAY) 0.1 % ophthalmic solution Place 1 drop into the left eye 2 (two) times daily.   [DISCONTINUED] losartan (COZAAR) 50 MG tablet Take 1 tablet (50 mg total) by mouth daily.   Current Facility-Administered Medications for the 05/22/22 encounter (Office Visit) with Kate Sable, MD  Medication   betamethasone acetate-betamethasone sodium phosphate (CELESTONE) injection 3 mg   betamethasone acetate-betamethasone sodium phosphate  (CELESTONE) injection 3 mg     Allergies:   Elemental sulfur   Social History   Socioeconomic History   Marital status: Single    Spouse name: Not on file   Number of children: Not on file   Years of education: Not on file   Highest education level: Not on file  Occupational History   Not on file  Tobacco Use   Smoking status: Former    Packs/day: 6.00    Types: Cigarettes    Quit date: 12/05/2018    Years since quitting: 3.4   Smokeless tobacco: Never   Tobacco comments:    social smoker   Vaping Use   Vaping Use: Never used  Substance and Sexual Activity   Alcohol use: Yes    Alcohol/week: 1.0 standard drink of alcohol    Types: 1 Glasses of wine per week    Comment: occasionally   Drug use: Never   Sexual activity: Not Currently  Other Topics Concern   Not on file  Social History Narrative   Not on file   Social Determinants of Health   Financial Resource Strain: Not on file  Food Insecurity: Not on file  Transportation Needs: Not on file  Physical Activity: Not on file  Stress: Not on file  Social Connections: Not on file     Family History: The patient's family history includes Cancer in her mother; Diabetes in her brother; Heart disease (age of onset: 59) in her mother; Hyperlipidemia in her mother and sister;  Hypertension in her brother, mother, and sister.  ROS:   Please see the history of present illness.     All other systems reviewed and are negative.  EKGs/Labs/Other Studies Reviewed:    The following studies were reviewed today:   EKG:  EKG not ordered today.   Recent Labs: No results found for requested labs within last 365 days.  Recent Lipid Panel    Component Value Date/Time   CHOL 189 07/07/2020 1123   TRIG 106 07/07/2020 1123   HDL 56 07/07/2020 1123   LDLCALC 114 (H) 07/07/2020 1123    Physical Exam:    VS:  BP (!) 142/90 (BP Location: Left Arm, Patient Position: Sitting, Cuff Size: Large)   Pulse 64   Ht 5\' 6"  (1.676 m)    Wt 258 lb 3.2 oz (117.1 kg)   SpO2 99%   BMI 41.67 kg/m     Wt Readings from Last 3 Encounters:  05/22/22 258 lb 3.2 oz (117.1 kg)  05/04/22 251 lb 1.7 oz (113.9 kg)  12/21/21 251 lb (113.9 kg)     GEN:  Well nourished, well developed in no acute distress HEENT: Normal NECK: No JVD; No carotid bruits CARDIAC: RRR RESPIRATORY:  Clear to auscultation without rales, wheezing or rhonchi  ABDOMEN: Soft, non-tender, non-distended MUSCULOSKELETAL:  No edema; right foot slightly tender to palpation SKIN: Warm and dry NEUROLOGIC:  Alert and oriented x 3 PSYCHIATRIC:  Normal affect   ASSESSMENT:    1. Primary hypertension   2. Pure hypercholesterolemia   3. Morbid obesity (HCC)    PLAN:    In order of problems listed above:  Hypertension, BP elevated, increase losartan to 100 mg daily, check BMP in 1 week.  Low-salt diet advised. Hyperlipidemia, continue low-cholesterol diet . Obesity.  Low-calorie diet, weight loss advised.    Follow-up in 2-3 months   Medication Adjustments/Labs and Tests Ordered: Current medicines are reviewed at length with the patient today.  Concerns regarding medicines are outlined above.  Orders Placed This Encounter  Procedures   Basic metabolic panel   Meds ordered this encounter  Medications   losartan (COZAAR) 50 MG tablet    Sig: Take 2 tablets (100 mg total) by mouth daily.    Dispense:  90 tablet    Refill:  3    Patient Instructions  Medication Instructions:   Your physician has recommended you make the following change in your medication:    INCREASE your Losartan to 100 MG once a day.  *If you need a refill on your cardiac medications before your next appointment, please call your pharmacy*   Lab Work:  Your physician recommends that you return for lab work (BMP) in: 1 week  - Please go to the [facility]. You will check in at the front desk to the right as you walk into the atrium. Valet Parking is offered if  needed. - No appointment needed. You may go any day between 7 am and 6 pm.    Follow-Up: At [facility], you and your health needs are our priority.  As part of our continuing mission to provide you with exceptional heart care, we have created designated Provider Care Teams.  These Care Teams include your primary Cardiologist (physician) and Advanced Practice Providers (APPs -  Physician Assistants and Nurse Practitioners) who all work together to provide you with the care you need, when you need it.  We recommend signing up for the patient portal called "MyChart".  Sign up information is provided on this After Visit Summary.  MyChart is used to connect with patients for Virtual Visits (Telemedicine).  Patients are able to view lab/test results, encounter notes, upcoming appointments, etc.  Non-urgent messages can be sent to your provider as well.   To learn more about what you can do with MyChart, go to [email or web address].    Your next appointment:   2-3 month(s)  The format for your next appointment:   In Person  Provider:   Debbe Odea, MD    Other Instructions   Important Information About Sugar         Signed, Debbe Odea, MD  05/22/2022 9:40 AM    [facility]

## 2022-05-29 ENCOUNTER — Other Ambulatory Visit
Admission: RE | Admit: 2022-05-29 | Discharge: 2022-05-29 | Disposition: A | Discharge status: Home / Self Care | Payer: BC Managed Care – PPO | Attending: Cardiology | Admitting: Cardiology

## 2022-05-29 DIAGNOSIS — I10 Essential (primary) hypertension: Secondary | ICD-10-CM | POA: Insufficient documentation

## 2022-05-29 LAB — BASIC METABOLIC PANEL
Anion gap: 8 (ref 5–15)
BUN: 21 mg/dL — ABNORMAL HIGH (ref 6–20)
CO2: 24 mmol/L (ref 22–32)
Calcium: 9.2 mg/dL (ref 8.9–10.3)
Chloride: 108 mmol/L (ref 98–111)
Creatinine, Ser: 1.01 mg/dL — ABNORMAL HIGH (ref 0.44–1.00)
GFR, Estimated: >60 mL/min (ref >60)
Glucose, Bld: 99 mg/dL (ref 70–99)
Potassium: 4.4 mmol/L (ref 3.5–5.1)
Sodium: 140 mmol/L (ref 135–145)

## 2022-07-23 ENCOUNTER — Ambulatory Visit: Payer: BC Managed Care – PPO | Attending: Cardiology | Admitting: Cardiology

## 2022-07-23 ENCOUNTER — Encounter: Payer: Self-pay | Admitting: Cardiology

## 2022-07-23 VITALS — BP 140/88 | HR 65 | Ht 67.0 in | Wt 254.8 lb

## 2022-07-23 DIAGNOSIS — I10 Essential (primary) hypertension: Secondary | ICD-10-CM | POA: Diagnosis not present

## 2022-07-23 DIAGNOSIS — E7800 Pure hypercholesterolemia, unspecified: Secondary | ICD-10-CM | POA: Diagnosis not present

## 2022-07-23 MED ORDER — AMLODIPINE BESYLATE 5 MG PO TABS: 5.0000 mg | ORAL_TABLET | Freq: Every day | ORAL | 3 refills | Status: DC

## 2022-07-23 NOTE — Patient Instructions (Signed)
Medication Instructions:   Your physician has recommended you make the following change in your medication:    START taking Amlodipine (Norvasc) 5 MG once a day.  *If you need a refill on your cardiac medications before your next appointment, please call your pharmacy*    Follow-Up: At [facility], you and your health needs are our priority.  As part of our continuing mission to provide you with exceptional heart care, we have created designated Provider Care Teams.  These Care Teams include your primary Cardiologist (physician) and Advanced Practice Providers (APPs -  Physician Assistants and Nurse Practitioners) who all work together to provide you with the care you need, when you need it.  We recommend signing up for the patient portal called "MyChart".  Sign up information is provided on this After Visit Summary.  MyChart is used to connect with patients for Virtual Visits (Telemedicine).  Patients are able to view lab/test results, encounter notes, upcoming appointments, etc.  Non-urgent messages can be sent to your provider as well.   To learn more about what you can do with MyChart, go to [email or web address].    Your next appointment:   3 month(s)  The format for your next appointment:   In Person  Provider:   Kate Sable, MD    Other Instructions   Important Information About Sugar

## 2022-07-23 NOTE — Progress Notes (Signed)
Cardiology Office Note:    Date:  07/23/2022   ID:  Sandra Brandt, DOB November 05, 1965, MRN [id number]  PCP:  Patient, No Pcp Per  Cardiologist:  Kate Sable, MD  Electrophysiologist:  None   Referring MD: No ref. provider found   Chief Complaint  Patient presents with   Follow-up    HTN follow up, no new Cardiac concens    History of Present Illness:    Sandra Brandt is a 56 y.o. female with a hx of GERD, hypertension, hyperlipidemia who presents for follow-up.  Losartan previously increased to 100 mg daily, BP not adequately controlled.  Endorses eating salty foods.  Compliant with medications as prescribed.  No new concerns at this time.  Prior notes Echo 02/9628 normal systolic and diastolic function, EF 65 to 70%. Coronary CTA 11/2019, calcium score of 0, no evidence of CAD.  Past Medical History:  Diagnosis Date   GERD (gastroesophageal reflux disease)    Hypertension    Plantar fasciitis of right foot 08/18/2020   Seasonal allergies    Trichomoniasis 08/2020    Past Surgical History:  Procedure Laterality Date   BUNIONECTOMY     FOOT SURGERY      Current Medications: Current Meds  Medication Sig   amLODipine (NORVASC) 5 MG tablet Take 1 tablet (5 mg total) by mouth daily.   losartan (COZAAR) 50 MG tablet Take 2 tablets (100 mg total) by mouth daily.   meloxicam (MOBIC) 15 MG tablet Take 1 tablet (15 mg total) by mouth daily.   Misc Natural Products (AIRBORNE ELDERBERRY) CHEW Chew by mouth.   mometasone (NASONEX) 50 MCG/ACT nasal spray Place 2 sprays into the nose daily.   olopatadine (PATADAY) 0.1 % ophthalmic solution Place 1 drop into the left eye 2 (two) times daily. (Patient taking differently: Place 1 drop into the left eye 2 (two) times daily as needed for allergies.)   predniSONE (DELTASONE) 10 MG tablet Take 3 tablets (30 mg total) by mouth daily with breakfast.   Current Facility-Administered Medications for the 07/23/22 encounter (Office Visit)  with Kate Sable, MD  Medication   betamethasone acetate-betamethasone sodium phosphate (CELESTONE) injection 3 mg   betamethasone acetate-betamethasone sodium phosphate (CELESTONE) injection 3 mg     Allergies:   Elemental sulfur   Social History   Socioeconomic History   Marital status: Single    Spouse name: Not on file   Number of children: Not on file   Years of education: Not on file   Highest education level: Not on file  Occupational History   Not on file  Tobacco Use   Smoking status: Former    Packs/day: 6.00    Types: Cigarettes    Quit date: 12/05/2018    Years since quitting: 3.6   Smokeless tobacco: Never   Tobacco comments:    social smoker   Vaping Use   Vaping Use: Never used  Substance and Sexual Activity   Alcohol use: Yes    Alcohol/week: 1.0 standard drink of alcohol    Types: 1 Glasses of wine per week    Comment: occasionally   Drug use: Never   Sexual activity: Not Currently  Other Topics Concern   Not on file  Social History Narrative   Not on file   Social Determinants of Health   Financial Resource Strain: Not on file  Food Insecurity: Not on file  Transportation Needs: Not on file  Physical Activity: Not on file  Stress: Not on file  Social Connections: Not on file     Family History: The patient's family history includes Cancer in her mother; Diabetes in her brother; Heart disease (age of onset: 42) in her mother; Hyperlipidemia in her mother and sister; Hypertension in her brother, mother, and sister.  ROS:   Please see the history of present illness.     All other systems reviewed and are negative.  EKGs/Labs/Other Studies Reviewed:    The following studies were reviewed today:   EKG:  EKG is ordered today.  EKG shows normal sinus rhythm, normal ECG.  Recent Labs: 05/29/2022: BUN 21; Creatinine, Ser 1.01; Potassium 4.4; Sodium 140  Recent Lipid Panel    Component Value Date/Time   CHOL 189 07/07/2020 1123    TRIG 106 07/07/2020 1123   HDL 56 07/07/2020 1123   LDLCALC 114 (H) 07/07/2020 1123    Physical Exam:    VS:  BP (!) 140/88 (BP Location: Left Arm, Patient Position: Sitting, Cuff Size: Large)   Pulse 65   Ht 5\' 7"  (1.702 m)   Wt 254 lb 12.8 oz (115.6 kg)   SpO2 99%   BMI 39.91 kg/m     Wt Readings from Last 3 Encounters:  07/23/22 254 lb 12.8 oz (115.6 kg)  05/22/22 258 lb 3.2 oz (117.1 kg)  05/04/22 251 lb 1.7 oz (113.9 kg)     GEN:  Well nourished, well developed in no acute distress HEENT: Normal NECK: No JVD; No carotid bruits CARDIAC: RRR RESPIRATORY:  Clear to auscultation without rales, wheezing or rhonchi  ABDOMEN: Soft, non-tender, non-distended MUSCULOSKELETAL:  No edema; right foot slightly tender to palpation SKIN: Warm and dry NEUROLOGIC:  Alert and oriented x 3 PSYCHIATRIC:  Normal affect   ASSESSMENT:    1. Primary hypertension   2. Pure hypercholesterolemia   3. Morbid obesity (HCC)    PLAN:    In order of problems listed above:  Hypertension, BP elevated, start Norvasc 5 mg daily, continue losartan 100 mg daily..  Low-salt diet advised again strongly emphasized. Hyperlipidemia, continue low-cholesterol diet . Obesity.  Low-calorie diet, weight loss advised.    Follow-up in 3 months   Medication Adjustments/Labs and Tests Ordered: Current medicines are reviewed at length with the patient today.  Concerns regarding medicines are outlined above.  Orders Placed This Encounter  Procedures   EKG 12-Lead   Meds ordered this encounter  Medications   amLODipine (NORVASC) 5 MG tablet    Sig: Take 1 tablet (5 mg total) by mouth daily.    Dispense:  30 tablet    Refill:  3    Patient Instructions  Medication Instructions:   Your physician has recommended you make the following change in your medication:    START taking Amlodipine (Norvasc) 5 MG once a day.  *If you need a refill on your cardiac medications before your next appointment,  please call your pharmacy*    Follow-Up: At [facility], you and your health needs are our priority.  As part of our continuing mission to provide you with exceptional heart care, we have created designated Provider Care Teams.  These Care Teams include your primary Cardiologist (physician) and Advanced Practice Providers (APPs -  Physician Assistants and Nurse Practitioners) who all work together to provide you with the care you need, when you need it.  We recommend signing up for the patient portal called "MyChart".  Sign up information is provided on this After Visit Summary.  MyChart is used to connect  with patients for Virtual Visits (Telemedicine).  Patients are able to view lab/test results, encounter notes, upcoming appointments, etc.  Non-urgent messages can be sent to your provider as well.   To learn more about what you can do with MyChart, go to [email or web address].    Your next appointment:   3 month(s)  The format for your next appointment:   In Person  Provider:   Debbe Odea, MD    Other Instructions   Important Information About Sugar         Signed, Debbe Odea, MD  07/23/2022 11:14 AM    [facility]

## 2022-10-25 ENCOUNTER — Encounter: Payer: Self-pay | Admitting: Cardiology

## 2022-10-25 ENCOUNTER — Ambulatory Visit: Payer: BC Managed Care – PPO | Attending: Cardiology | Admitting: Cardiology

## 2022-10-25 VITALS — BP 162/94 | HR 70 | Ht 67.0 in | Wt 259.2 lb

## 2022-10-25 DIAGNOSIS — I10 Essential (primary) hypertension: Secondary | ICD-10-CM | POA: Diagnosis not present

## 2022-10-25 DIAGNOSIS — E7800 Pure hypercholesterolemia, unspecified: Secondary | ICD-10-CM

## 2022-10-25 MED ORDER — AMLODIPINE BESYLATE 5 MG PO TABS: 5.0000 mg | ORAL_TABLET | Freq: Every day | ORAL | 3 refills | Status: DC

## 2022-10-25 MED ORDER — LOSARTAN POTASSIUM 50 MG PO TABS: 100.0000 mg | ORAL_TABLET | Freq: Every day | ORAL | 3 refills | Status: DC

## 2022-10-25 NOTE — Patient Instructions (Signed)
Medication Instructions:   Your physician recommends that you continue on your current medications as directed. Please refer to the Current Medication list given to you today.  *If you need a refill on your cardiac medications before your next appointment, please call your pharmacy*   Lab Work:  None Ordered  If you have labs (blood work) drawn today and your tests are completely normal, you will receive your results only by: [place] (if you have MyChart) OR A paper copy in the mail If you have any lab test that is abnormal or we need to change your treatment, we will call you to review the results.   Testing/Procedures:  None Ordered    Follow-Up: At [facility], you and your health needs are our priority.  As part of our continuing mission to provide you with exceptional heart care, we have created designated Provider Care Teams.  These Care Teams include your primary Cardiologist (physician) and Advanced Practice Providers (APPs -  Physician Assistants and Nurse Practitioners) who all work together to provide you with the care you need, when you need it.  We recommend signing up for the patient portal called "MyChart".  Sign up information is provided on this After Visit Summary.  MyChart is used to connect with patients for Virtual Visits (Telemedicine).  Patients are able to view lab/test results, encounter notes, upcoming appointments, etc.  Non-urgent messages can be sent to your provider as well.   To learn more about what you can do with MyChart, go to [email or web address].    Your next appointment:   6 week(s)  Provider:   Kate Sable, MD ONLY

## 2022-10-25 NOTE — Progress Notes (Signed)
Cardiology Office Note:    Date:  10/25/2022   ID:  Sandra Brandt, DOB July 28, 1966, MRN [id number]  PCP:  Patient, No Pcp Per  Cardiologist:  Kate Sable, MD  Electrophysiologist:  None   Referring MD: No ref. provider found   Chief Complaint  Patient presents with   Follow-up    3  month f/u, no new cardiac concerns    History of Present Illness:    Sandra Brandt is a 57 y.o. female with a hx of GERD, hypertension, hyperlipidemia who presents for follow-up.  Losartan previously increased to 100 mg daily, BP not adequately controlled.  She does not check blood pressures frequently at home.  Has not taking BP meds since yesterday morning due to a working schedule.  Has cut back on eating salty foods.  Prior notes Echo 01/1760 normal systolic and diastolic function, EF 65 to 70%. Coronary CTA 11/2019, calcium score of 0, no evidence of CAD.  Past Medical History:  Diagnosis Date   GERD (gastroesophageal reflux disease)    Hypertension    Plantar fasciitis of right foot 08/18/2020   Seasonal allergies    Trichomoniasis 08/2020    Past Surgical History:  Procedure Laterality Date   BUNIONECTOMY     FOOT SURGERY      Current Medications: Current Meds  Medication Sig   meloxicam (MOBIC) 15 MG tablet Take 1 tablet (15 mg total) by mouth daily.   Misc Natural Products (AIRBORNE ELDERBERRY) CHEW Chew by mouth.   mometasone (NASONEX) 50 MCG/ACT nasal spray Place 2 sprays into the nose daily.   olopatadine (PATADAY) 0.1 % ophthalmic solution Place 1 drop into the left eye 2 (two) times daily. (Patient taking differently: Place 1 drop into the left eye 2 (two) times daily as needed for allergies.)   predniSONE (DELTASONE) 10 MG tablet Take 3 tablets (30 mg total) by mouth daily with breakfast.   [DISCONTINUED] amLODipine (NORVASC) 5 MG tablet Take 1 tablet (5 mg total) by mouth daily.   [DISCONTINUED] losartan (COZAAR) 50 MG tablet Take 2 tablets (100 mg total) by mouth  daily.   Current Facility-Administered Medications for the 10/25/22 encounter (Office Visit) with Kate Sable, MD  Medication   betamethasone acetate-betamethasone sodium phosphate (CELESTONE) injection 3 mg   betamethasone acetate-betamethasone sodium phosphate (CELESTONE) injection 3 mg     Allergies:   Elemental sulfur   Social History   Socioeconomic History   Marital status: Single    Spouse name: Not on file   Number of children: Not on file   Years of education: Not on file   Highest education level: Not on file  Occupational History   Not on file  Tobacco Use   Smoking status: Former    Packs/day: 6.00    Types: Cigarettes    Quit date: 12/05/2018    Years since quitting: 3.8   Smokeless tobacco: Never   Tobacco comments:    social smoker   Vaping Use   Vaping Use: Never used  Substance and Sexual Activity   Alcohol use: Yes    Alcohol/week: 1.0 standard drink of alcohol    Types: 1 Glasses of wine per week    Comment: occasionally   Drug use: Never   Sexual activity: Not Currently  Other Topics Concern   Not on file  Social History Narrative   Not on file   Social Determinants of Health   Financial Resource Strain: Not on file  Food Insecurity: Not on file  Transportation Needs: Not on file  Physical Activity: Not on file  Stress: Not on file  Social Connections: Not on file     Family History: The patient's family history includes Cancer in her mother; Diabetes in her brother; Heart disease (age of onset: 34) in her mother; Hyperlipidemia in her mother and sister; Hypertension in her brother, mother, and sister.  ROS:   Please see the history of present illness.     All other systems reviewed and are negative.  EKGs/Labs/Other Studies Reviewed:    The following studies were reviewed today:   EKG:  EKG not ordered today.   Recent Labs: 05/29/2022: BUN 21; Creatinine, Ser 1.01; Potassium 4.4; Sodium 140  Recent Lipid Panel    Component  Value Date/Time   CHOL 189 07/07/2020 1123   TRIG 106 07/07/2020 1123   HDL 56 07/07/2020 1123   LDLCALC 114 (H) 07/07/2020 1123    Physical Exam:    VS:  BP (!) 162/94 (BP Location: Left Arm, Patient Position: Sitting, Cuff Size: Large)   Pulse 70   Ht 5\' 7"  (1.702 m)   Wt 259 lb 3.2 oz (117.6 kg)   SpO2 100%   BMI 40.60 kg/m     Wt Readings from Last 3 Encounters:  10/25/22 259 lb 3.2 oz (117.6 kg)  07/23/22 254 lb 12.8 oz (115.6 kg)  05/22/22 258 lb 3.2 oz (117.1 kg)     GEN:  Well nourished, well developed in no acute distress HEENT: Normal NECK: No JVD; No carotid bruits CARDIAC: RRR RESPIRATORY:  Clear to auscultation without rales, wheezing or rhonchi  ABDOMEN: Soft, non-tender, non-distended MUSCULOSKELETAL:  No edema; right foot slightly tender to palpation SKIN: Warm and dry NEUROLOGIC:  Alert and oriented x 3 PSYCHIATRIC:  Normal affect   ASSESSMENT:    1. Primary hypertension   2. Pure hypercholesterolemia   3. Morbid obesity ([place])     PLAN:    In order of problems listed above:  Hypertension, BP elevated likely from medication noncompliance.  Advised to take medicines as prescribed.  Continue losartan 100 mg daily, Norvasc 5 mg daily.  Consider adding HCTZ at follow-up visit if BP not adequately controlled.  Over 5 minutes spent counseling patient regarding medication compliance, effects of uncontrolled BP. Hyperlipidemia, continue low-cholesterol diet . Obesity.  Low-calorie diet, weight loss advised.    Follow-up in 6 weeks.   Medication Adjustments/Labs and Tests Ordered: Current medicines are reviewed at length with the patient today.  Concerns regarding medicines are outlined above.  No orders of the defined types were placed in this encounter.  Meds ordered this encounter  Medications   amLODipine (NORVASC) 5 MG tablet    Sig: Take 1 tablet (5 mg total) by mouth daily.    Dispense:  90 tablet    Refill:  3   losartan (COZAAR) 50 MG  tablet    Sig: Take 2 tablets (100 mg total) by mouth daily.    Dispense:  90 tablet    Refill:  3    Patient Instructions  Medication Instructions:   Your physician recommends that you continue on your current medications as directed. Please refer to the Current Medication list given to you today.  *If you need a refill on your cardiac medications before your next appointment, please call your pharmacy*   Lab Work:  None Ordered  If you have labs (blood work) drawn today and your tests are completely normal, you will receive your results only by:  MyChart Message (if you have MyChart) OR A paper copy in the mail If you have any lab test that is abnormal or we need to change your treatment, we will call you to review the results.   Testing/Procedures:  None Ordered    Follow-Up: At [facility], you and your health needs are our priority.  As part of our continuing mission to provide you with exceptional heart care, we have created designated Provider Care Teams.  These Care Teams include your primary Cardiologist (physician) and Advanced Practice Providers (APPs -  Physician Assistants and Nurse Practitioners) who all work together to provide you with the care you need, when you need it.  We recommend signing up for the patient portal called "MyChart".  Sign up information is provided on this After Visit Summary.  MyChart is used to connect with patients for Virtual Visits (Telemedicine).  Patients are able to view lab/test results, encounter notes, upcoming appointments, etc.  Non-urgent messages can be sent to your provider as well.   To learn more about what you can do with MyChart, go to [email or web address].    Your next appointment:   6 week(s)  Provider:   Kate Sable, MD ONLY      Signed, Kate Sable, MD  10/25/2022 9:59 AM    [place]

## 2022-12-12 ENCOUNTER — Ambulatory Visit: Payer: BC Managed Care – PPO | Admitting: Cardiology

## 2023-01-03 ENCOUNTER — Encounter: Payer: Self-pay | Admitting: Cardiology

## 2023-01-03 ENCOUNTER — Ambulatory Visit: Payer: BC Managed Care – PPO | Attending: Cardiology | Admitting: Cardiology

## 2023-01-03 VITALS — BP 134/92 | HR 76 | Ht 66.0 in | Wt 259.6 lb

## 2023-01-03 DIAGNOSIS — E7800 Pure hypercholesterolemia, unspecified: Secondary | ICD-10-CM

## 2023-01-03 DIAGNOSIS — I10 Essential (primary) hypertension: Secondary | ICD-10-CM | POA: Diagnosis not present

## 2023-01-03 MED ORDER — LOSARTAN POTASSIUM 100 MG PO TABS: 100.0000 mg | ORAL_TABLET | Freq: Every day | ORAL | 3 refills | Status: DC

## 2023-01-03 NOTE — Patient Instructions (Signed)
Medication Instructions:   Your physician recommends that you continue on your current medications as directed. Please refer to the Current Medication list given to you today.  *If you need a refill on your cardiac medications before your next appointment, please call your pharmacy*   Lab Work:  None Ordered  If you have labs (blood work) drawn today and your tests are completely normal, you will receive your results only by: MyChart Message (if you have MyChart) OR A paper copy in the mail If you have any lab test that is abnormal or we need to change your treatment, we will call you to review the results.   Testing/Procedures:  None Ordered   Follow-Up: At [facility], you and your health needs are our priority.  As part of our continuing mission to provide you with exceptional heart care, we have created designated Provider Care Teams.  These Care Teams include your primary Cardiologist (physician) and Advanced Practice Providers (APPs -  Physician Assistants and Nurse Practitioners) who all work together to provide you with the care you need, when you need it.  We recommend signing up for the patient portal called "MyChart".  Sign up information is provided on this After Visit Summary.  MyChart is used to connect with patients for Virtual Visits (Telemedicine).  Patients are able to view lab/test results, encounter notes, upcoming appointments, etc.  Non-urgent messages can be sent to your provider as well.   To learn more about what you can do with MyChart, go to [email or web address].    Your next appointment:   6 month(s)  Provider:   You may see Brian Agbor-Etang, MD or one of the following Advanced Practice Providers on your designated Care Team:   Christopher Berge, NP Ryan Dunn, PA-C Cadence Furth, PA-C Sheri Hammock, NP 

## 2023-01-03 NOTE — Progress Notes (Signed)
Cardiology Office Note:    Date:  01/03/2023   ID:  Sandra Brandt, DOB 05/16/1966, MRN [id number]  PCP:  Patient, No Pcp Per  Cardiologist:  Debbe Odea, MD  Electrophysiologist:  None   Referring MD: No ref. provider found   Chief Complaint  Patient presents with   Follow-up    Patient denies new or acute cardiac problems/concerns today.      History of Present Illness:    Sandra Brandt is a 57 y.o. female with a hx of GERD, hypertension, hyperlipidemia who presents for follow-up.  Being seen for hypertension, low-salt diet was emphasized at last visit due to systolics in the 160s.  States being compliant with medications as prescribed, also cut back on salt.  She feels well, no acute events at this time, blood pressures at home have improved currently in the 130s to 140s systolic.  Prior notes Echo 10/2019 normal systolic and diastolic function, EF 65 to 70%. Coronary CTA 11/2019, calcium score of 0, no evidence of CAD.  Past Medical History:  Diagnosis Date   GERD (gastroesophageal reflux disease)    Hypertension    Plantar fasciitis of right foot 08/18/2020   Seasonal allergies    Trichomoniasis 08/2020    Past Surgical History:  Procedure Laterality Date   BUNIONECTOMY     FOOT SURGERY      Current Medications: Current Meds  Medication Sig   amLODipine (NORVASC) 5 MG tablet Take 1 tablet (5 mg total) by mouth daily.   losartan (COZAAR) 100 MG tablet Take 1 tablet (100 mg total) by mouth daily.   meloxicam (MOBIC) 15 MG tablet Take 1 tablet (15 mg total) by mouth daily.   Misc Natural Products (AIRBORNE ELDERBERRY) CHEW Chew by mouth.   mometasone (NASONEX) 50 MCG/ACT nasal spray Place 2 sprays into the nose daily.   olopatadine (PATADAY) 0.1 % ophthalmic solution Place 1 drop into the left eye 2 (two) times daily. (Patient taking differently: Place 1 drop into the left eye 2 (two) times daily as needed for allergies.)   [DISCONTINUED] losartan (COZAAR) 50  MG tablet Take 2 tablets (100 mg total) by mouth daily.   Current Facility-Administered Medications for the 01/03/23 encounter (Office Visit) with Debbe Odea, MD  Medication   betamethasone acetate-betamethasone sodium phosphate (CELESTONE) injection 3 mg   betamethasone acetate-betamethasone sodium phosphate (CELESTONE) injection 3 mg     Allergies:   Elemental sulfur   Social History   Socioeconomic History   Marital status: Single    Spouse name: Not on file   Number of children: Not on file   Years of education: Not on file   Highest education level: Not on file  Occupational History   Not on file  Tobacco Use   Smoking status: Former    Packs/day: 6    Types: Cigarettes    Quit date: 12/05/2018    Years since quitting: 4.0   Smokeless tobacco: Never   Tobacco comments:    social smoker   Vaping Use   Vaping Use: Never used  Substance and Sexual Activity   Alcohol use: Yes    Alcohol/week: 1.0 standard drink of alcohol    Types: 1 Glasses of wine per week    Comment: occasionally   Drug use: Never   Sexual activity: Not Currently  Other Topics Concern   Not on file  Social History Narrative   Not on file   Social Determinants of Health   Financial Resource Strain:  Not on file  Food Insecurity: Not on file  Transportation Needs: Not on file  Physical Activity: Not on file  Stress: Not on file  Social Connections: Not on file     Family History: The patient's family history includes Cancer in her mother; Diabetes in her brother; Heart disease (age of onset: 25) in her mother; Hyperlipidemia in her mother and sister; Hypertension in her brother, mother, and sister.  ROS:   Please see the history of present illness.     All other systems reviewed and are negative.  EKGs/Labs/Other Studies Reviewed:    The following studies were reviewed today:   EKG:  EKG not ordered today.   Recent Labs: 05/29/2022: BUN 21; Creatinine, Ser 1.01; Potassium 4.4;  Sodium 140  Recent Lipid Panel    Component Value Date/Time   CHOL 189 07/07/2020 1123   TRIG 106 07/07/2020 1123   HDL 56 07/07/2020 1123   LDLCALC 114 (H) 07/07/2020 1123    Physical Exam:    VS:  BP (!) 134/92 (BP Location: Left Arm, Patient Position: Sitting, Cuff Size: Large)   Pulse 76   Ht 5\' 6"  (1.676 m)   Wt 259 lb 9.6 oz (117.8 kg)   SpO2 99%   BMI 41.90 kg/m     Wt Readings from Last 3 Encounters:  01/03/23 259 lb 9.6 oz (117.8 kg)  10/25/22 259 lb 3.2 oz (117.6 kg)  07/23/22 254 lb 12.8 oz (115.6 kg)     GEN:  Well nourished, well developed in no acute distress HEENT: Normal NECK: No JVD; No carotid bruits CARDIAC: RRR RESPIRATORY:  Clear to auscultation without rales, wheezing or rhonchi  ABDOMEN: Soft, non-tender, non-distended MUSCULOSKELETAL:  No edema; right foot slightly tender to palpation SKIN: Warm and dry NEUROLOGIC:  Alert and oriented x 3 PSYCHIATRIC:  Normal affect   ASSESSMENT:    1. Primary hypertension   2. Pure hypercholesterolemia   3. Morbid obesity    PLAN:    In order of problems listed above:  Hypertension, BP elevated but much improved from prior, systolic in the 130s. Continue losartan 100 mg daily, Norvasc 5 mg daily.  Low-salt diet again emphasized.  Consider increasing Norvasc if BP stays elevated at follow-up visit. Hyperlipidemia, continue low-cholesterol diet . Obesity.  Low-calorie diet, weight loss advised.    Follow-up in 6 months.   Medication Adjustments/Labs and Tests Ordered: Current medicines are reviewed at length with the patient today.  Concerns regarding medicines are outlined above.  No orders of the defined types were placed in this encounter.  Meds ordered this encounter  Medications   losartan (COZAAR) 100 MG tablet    Sig: Take 1 tablet (100 mg total) by mouth daily.    Dispense:  90 tablet    Refill:  3    Patient Instructions  Medication Instructions:   Your physician recommends that you  continue on your current medications as directed. Please refer to the Current Medication list given to you today.  *If you need a refill on your cardiac medications before your next appointment, please call your pharmacy*   Lab Work:  None Ordered  If you have labs (blood work) drawn today and your tests are completely normal, you will receive your results only by: MyChart Message (if you have MyChart) OR A paper copy in the mail If you have any lab test that is abnormal or we need to change your treatment, we will call you to review the results.  Testing/Procedures:  None Ordered    Follow-Up: At [facility], you and your health needs are our priority.  As part of our continuing mission to provide you with exceptional heart care, we have created designated Provider Care Teams.  These Care Teams include your primary Cardiologist (physician) and Advanced Practice Providers (APPs -  Physician Assistants and Nurse Practitioners) who all work together to provide you with the care you need, when you need it.  We recommend signing up for the patient portal called "MyChart".  Sign up information is provided on this After Visit Summary.  MyChart is used to connect with patients for Virtual Visits (Telemedicine).  Patients are able to view lab/test results, encounter notes, upcoming appointments, etc.  Non-urgent messages can be sent to your provider as well.   To learn more about what you can do with MyChart, go to [email or web address].    Your next appointment:   6 month(s)  Provider:   You may see Debbe Odea, MD or one of the following Advanced Practice Providers on your designated Care Team:   Nicolasa Ducking, NP Eula Listen, PA-C Cadence Fransico Michael, PA-C Charlsie Quest, NP    Signed, Debbe Odea, MD  01/03/2023 10:51 AM    [facility]

## 2023-02-01 ENCOUNTER — Encounter: Payer: Self-pay | Admitting: *Deleted

## 2023-02-01 ENCOUNTER — Ambulatory Visit (INDEPENDENT_AMBULATORY_CARE_PROVIDER_SITE_OTHER): Payer: BC Managed Care – PPO

## 2023-02-01 ENCOUNTER — Ambulatory Visit: Payer: BC Managed Care – PPO | Admitting: Podiatry

## 2023-02-01 DIAGNOSIS — M7751 Other enthesopathy of right foot: Secondary | ICD-10-CM | POA: Diagnosis not present

## 2023-02-01 DIAGNOSIS — M778 Other enthesopathies, not elsewhere classified: Secondary | ICD-10-CM

## 2023-02-01 DIAGNOSIS — M722 Plantar fascial fibromatosis: Secondary | ICD-10-CM

## 2023-02-01 MED ORDER — METHYLPREDNISOLONE 4 MG PO TBPK: ORAL_TABLET | ORAL | 0 refills | Status: DC

## 2023-02-01 MED ORDER — MELOXICAM 15 MG PO TABS: 15.0000 mg | ORAL_TABLET | Freq: Every day | ORAL | 1 refills | Status: DC

## 2023-02-01 MED ORDER — BETAMETHASONE SOD PHOS & ACET 6 (3-3) MG/ML IJ SUSP
3.0000 mg | Freq: Once | INTRAMUSCULAR | Status: AC
  Administered 2023-02-01: 3 mg via INTRA_ARTICULAR

## 2023-02-01 NOTE — Progress Notes (Signed)
   Chief Complaint  Patient presents with   Foot Pain    Patient cam in today for bilateral heel and ankle pain, rate of pain 10 out of 10, patient Is doing a lot of walking, and having pain by the end of the day, rate of pain 10 out of 10, X-Rays done today     HPI: 57 y.o. female presenting today for follow-up evaluation of bilateral foot pain.  Patient states the right foot and ankle is very painful now on a daily basis.  She says is that she walks all over campus at [employer] [facility] [employer] [employer] throughout her work shift and by the end of her work shift she has significant pain and tenderness.  Presenting for further treatment and evaluation  Past Medical History:  Diagnosis Date   GERD (gastroesophageal reflux disease)    Hypertension    Plantar fasciitis of right foot 08/18/2020   Seasonal allergies    Trichomoniasis 08/2020    Past Surgical History:  Procedure Laterality Date   BUNIONECTOMY     FOOT SURGERY      Allergies  Allergen Reactions   Elemental Sulfur Dermatitis     Physical Exam: General: The patient is alert and oriented x3 in no acute distress.  Dermatology: Skin is warm, dry and supple bilateral lower extremities. Negative for open lesions or macerations.  Vascular: Pulses are palpable.  Skin is warm to touch.  Capillary refill WNL.  Mild edema noted  Neurological: Light touch and protective threshold grossly intact  Musculoskeletal Exam: No pedal deformities noted.  Significant tenderness to palpation along the anterolateral aspect of the right ankle as well as the plantar fascia right.  There is some limited range of motion and stiffness as well  Assessment: 1.  Capsulitis right ankle 2.  PSxHx EPF RT. 02/2021 3.  Plantar fasciitis right  4.  Generalized foot pain bilateral  -Patient evaluated -Injection of 0.5 cc Celestone Soluspan injected into the lateral aspect of the right ankle as well as the plantar fascia right -Prescription for Medrol  Dosepak -Prescription for meloxicam 15 mg daily -Advised against going barefoot.  Recommend good supportive shoes and sneakers at all times -Note for work was provided to refrain from transferring to different buildings throughout work.  Recommend working in the same building during her shift -[employer] parking placard form completed for the patient today -Return to clinic as needed      [name], DPM [facility]  Dr. [name], DPM    [address] [place], [place] [address]                Office [phone]  Fax [phone]

## 2023-02-15 ENCOUNTER — Other Ambulatory Visit: Payer: Self-pay | Admitting: Podiatry

## 2023-02-15 DIAGNOSIS — M722 Plantar fascial fibromatosis: Secondary | ICD-10-CM

## 2023-02-15 DIAGNOSIS — M7751 Other enthesopathy of right foot: Secondary | ICD-10-CM

## 2023-02-15 DIAGNOSIS — M778 Other enthesopathies, not elsewhere classified: Secondary | ICD-10-CM

## 2023-02-26 DIAGNOSIS — H04123 Dry eye syndrome of bilateral lacrimal glands: Secondary | ICD-10-CM | POA: Diagnosis not present

## 2023-02-26 DIAGNOSIS — H1045 Other chronic allergic conjunctivitis: Secondary | ICD-10-CM | POA: Diagnosis not present

## 2023-06-08 ENCOUNTER — Emergency Department
Admission: EM | Admit: 2023-06-08 | Discharge: 2023-06-08 | Disposition: A | Discharge status: Home / Self Care | Payer: BC Managed Care – PPO | Attending: Emergency Medicine | Admitting: Emergency Medicine

## 2023-06-08 ENCOUNTER — Other Ambulatory Visit: Payer: Self-pay

## 2023-06-08 ENCOUNTER — Emergency Department: Payer: BC Managed Care – PPO

## 2023-06-08 DIAGNOSIS — M79641 Pain in right hand: Secondary | ICD-10-CM | POA: Insufficient documentation

## 2023-06-08 DIAGNOSIS — M546 Pain in thoracic spine: Secondary | ICD-10-CM | POA: Diagnosis not present

## 2023-06-08 DIAGNOSIS — I10 Essential (primary) hypertension: Secondary | ICD-10-CM | POA: Diagnosis not present

## 2023-06-08 DIAGNOSIS — M542 Cervicalgia: Secondary | ICD-10-CM

## 2023-06-08 DIAGNOSIS — M5412 Radiculopathy, cervical region: Secondary | ICD-10-CM | POA: Insufficient documentation

## 2023-06-08 MED ORDER — PREDNISONE 10 MG PO TABS: ORAL_TABLET | ORAL | 0 refills | Status: DC

## 2023-06-08 MED ORDER — METHOCARBAMOL 500 MG PO TABS: 500.0000 mg | ORAL_TABLET | Freq: Every day | ORAL | 0 refills | Status: DC

## 2023-06-08 NOTE — ED Triage Notes (Signed)
Pt to ED for right hand pain and neck pain for one month.

## 2023-06-08 NOTE — Discharge Instructions (Addendum)
Follow-up with your primary care provider or  urgent care if any continued problems.  A prescription for a mild muscle relaxant was sent to the pharmacy along with a prescription for prednisone.  Take as directed.  Do not take the muscle relaxant if you plan on driving or operating machinery.   Also use warm moist compresses or heating pad to your neck and shoulders as needed for discomfort.

## 2023-06-08 NOTE — ED Provider Notes (Signed)
[facility] Provider Note    Event Date/Time   First MD Initiated Contact with Patient 06/08/23 0830     (approximate)   History   Hand Pain and Neck Pain   HPI  Sandra Brandt is a 57 y.o. female presents to the ED with complaint of upper back pain/neck pain and right hand pain.  Denies any recent injury.  She states she has been having symptoms for approximately 1 month.  Currently she is wearing a brace on her hand which has not helped with her symptoms.  Patient has a history of hypertension, GERD.  Patient has not taken her blood pressure medication today.     Physical Exam   Triage Vital Signs: ED Triage Vitals  Encounter Vitals Group     BP 06/08/23 0828 (!) 161/102     Systolic BP Percentile --      Diastolic BP Percentile --      Pulse Rate 06/08/23 0828 72     Resp 06/08/23 0828 20     Temp --      Temp src --      SpO2 06/08/23 0828 100 %     Weight 06/08/23 0827 250 lb (113.4 kg)     Height 06/08/23 0827 5\' 6"  (1.676 m)     Head Circumference --      Peak Flow --      Pain Score 06/08/23 0827 8     Pain Loc --      Pain Education --      Exclude from Growth Chart --     Most recent vital signs: Vitals:   06/08/23 0828 06/08/23 0914  BP: (!) 161/102 (!) 133/95  Pulse: 72   Resp: 20   SpO2: 100%      General: Awake, no distress.  CV:  Good peripheral perfusion.  Regular rate rhythm. Resp:  Normal effort.  Lungs are clear bilaterally. Abd:  No distention.  Other:  No point tenderness on palpation of cervical spine posteriorly however paravertebral muscles are moderately tender especially to the right and trapezius muscle area.  No edema, restriction or difficulty with movement of the right upper extremity.   ED Results / Procedures / Treatments   Labs (all labs ordered are listed, but only abnormal results are displayed) Labs Reviewed - No data to display    RADIOLOGY  Cervical spine x-ray images were reviewed by  myself independent of the radiologist and was negative for fracture, degenerative disc disease or malalignment.  Radiology report suggest loss of lordosis suggesting muscle spasms.    PROCEDURES:  Critical Care performed:   Procedures   MEDICATIONS ORDERED IN ED: Medications - No data to display   IMPRESSION / MDM / ASSESSMENT AND PLAN / ED COURSE  I reviewed the triage vital signs and the nursing notes.   Differential diagnosis includes, but is not limited to, cervical strain, cervical pain with right upper extremity radiculopathy, degenerative disc disease.  57 year old female presents to the ED with complaint of 1 month neck and right hand pain without history of injury.  X-rays suggest muscle spasms but no bony abnormality noted.  I discussed the use of steroids and a muscle relaxant to be taken at bedtime since patient works third shift.  She is agreeable with this plan and will follow-up with her PCP if any continued problems or concerns.      Patient's presentation is most consistent with acute illness / injury with system  symptoms.  FINAL CLINICAL IMPRESSION(S) / ED DIAGNOSES   Final diagnoses:  Cervical pain  Cervical radiculopathy     Rx / DC Orders   ED Discharge Orders          Ordered    methocarbamol (ROBAXIN) 500 MG tablet  Daily at bedtime        06/08/23 0934    predniSONE (DELTASONE) 10 MG tablet        06/08/23 9528             Note:  This document was prepared using Dragon voice recognition software and may include unintentional dictation errors.   Tommi Rumps, PA-C 06/08/23 1412    Chesley Noon, MD 06/11/23 [phone]

## 2023-07-15 ENCOUNTER — Emergency Department
Admission: EM | Admit: 2023-07-15 | Discharge: 2023-07-15 | Disposition: A | Discharge status: Home / Self Care | Payer: BC Managed Care – PPO | Attending: Emergency Medicine | Admitting: Emergency Medicine

## 2023-07-15 ENCOUNTER — Encounter: Payer: Self-pay | Admitting: Emergency Medicine

## 2023-07-15 ENCOUNTER — Other Ambulatory Visit: Payer: Self-pay

## 2023-07-15 DIAGNOSIS — R220 Localized swelling, mass and lump, head: Secondary | ICD-10-CM | POA: Diagnosis not present

## 2023-07-15 DIAGNOSIS — I10 Essential (primary) hypertension: Secondary | ICD-10-CM | POA: Diagnosis not present

## 2023-07-15 DIAGNOSIS — T7840XA Allergy, unspecified, initial encounter: Secondary | ICD-10-CM | POA: Diagnosis not present

## 2023-07-15 NOTE — ED Notes (Signed)
See triage note  Presents with some swelling to inside of upper lip and some pain to jaw area  States she bit into an apple   the broke her clamp that holds her partial   and it broke her partial

## 2023-07-15 NOTE — ED Provider Notes (Signed)
[facility] Provider Note    Event Date/Time   First MD Initiated Contact with Patient 07/15/23 1839     (approximate)  History   Chief Complaint: Oral Swelling  HPI  Sandra Brandt is a 57 y.o. female with a past medical history of gastric reflux, hypertension, seasonal allergies, presents to the emergency department for upper lip swelling and itching.  According to the patient over the last 2 days or so she has noticed some itching of her face surrounding her lips and mild swelling of the upper lip.  Patient states she was recently taking prednisone for an issue in her hand but discontinued this several days ago.  Patient was concerned given the itching so she came to the emergency department for evaluation.  Patient denies any new exposures denies any new medications besides the prednisone which she has taken previously.  Denies any new make-up face wash shampoo conditioner, etc.  Physical Exam   Triage Vital Signs: ED Triage Vitals  Encounter Vitals Group     BP 07/15/23 1543 (!) 186/78     Systolic BP Percentile --      Diastolic BP Percentile --      Pulse Rate 07/15/23 1543 70     Resp 07/15/23 1543 18     Temp 07/15/23 1543 98.3 F (36.8 C)     Temp Source 07/15/23 1543 Oral     SpO2 07/15/23 1543 97 %     Weight 07/15/23 1543 250 lb (113.4 kg)     Height 07/15/23 1543 5\' 6"  (1.676 m)     Head Circumference --      Peak Flow --      Pain Score 07/15/23 1543 0     Pain Loc --      Pain Education --      Exclude from Growth Chart --     Most recent vital signs: Vitals:   07/15/23 1543 07/15/23 1829  BP: (!) 186/78 (!) 169/90  Pulse: 70 67  Resp: 18 18  Temp: 98.3 F (36.8 C)   SpO2: 97% 98%    General: Awake, no distress.  CV:  Good peripheral perfusion.  Regular rate and rhythm  Resp:  Normal effort.  Equal breath sounds bilaterally.  Abd:  No distention Other:  Patient has very minimal erythema of the bilateral cheeks and upper  lip.  Very slight swelling of the upper lip which is symmetric and not unilateral.  No intraoral lesions or blistering.   ED Results / Procedures / Treatments   MEDICATIONS ORDERED IN ED: Medications - No data to display   IMPRESSION / MDM / ASSESSMENT AND PLAN / ED COURSE  I reviewed the triage vital signs and the nursing notes.  Patient's presentation is most consistent with acute illness / injury with system symptoms.  Patient presents the emergency department for erythema itching and swelling of the upper lip and face.  States has been ongoing for the past 2 days.  Overall symptoms seem most consistent with may be a contact dermatitis versus eczema type reaction.  Patient denies any new exposures.  I discussed with the patient a trial of Benadryl and Claritin.  Patient strongly prefers not to go back on prednisone as she just recently came off this medication.  We will place the patient on Benadryl 50 mg every 6 hours, during the daytime she will take Claritin.  I discussed with the patient if her symptoms worsen or she develops any  swelling inside the mouth she needs to return to the emergency department immediately.  Otherwise she will follow-up with her doctor.  FINAL CLINICAL IMPRESSION(S) / ED DIAGNOSES   Allergic reaction  Rx / DC Orders   Benadryl Claritin  Note:  This document was prepared using Dragon voice recognition software and may include unintentional dictation errors.   Minna Antis, MD 07/15/23 1900

## 2023-07-15 NOTE — ED Triage Notes (Signed)
Pt here with lip swelling since Friday. Pt states her lip is painful and burning. Pt takes bp medication.

## 2023-07-15 NOTE — Discharge Instructions (Addendum)
As we discussed please use 50 mg (2 capsules) of Benadryl every 6 hours as needed for itch or rash.  You may use Claritin during the daytime.  Return to the emergency department for any worsening symptoms or any other symptom personally concerning to yourself.  Otherwise please follow-up with your doctor in 2 days.

## 2023-09-10 IMAGING — MR MR FOOT*R* W/O CM
6 series · 36 of 40 positions shown · non-contrast
Comparison: Radiograph 09/22/2021.

CLINICAL DATA: Foot pain, stress fracture suspected, neg xray

EXAM:
MRI OF THE RIGHT FOREFOOT WITHOUT CONTRAST
TECHNIQUE: Multiplanar, multisequence MR imaging of the right forefoot was
performed. No intravenous contrast was administered.

[Series 3: T1 · coronal · right · 3.0mm · 0.38mm/px · 8 of 47 slices shown (1 of 2)]
[im 1/47]
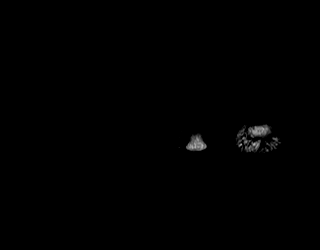
[im 6/47]
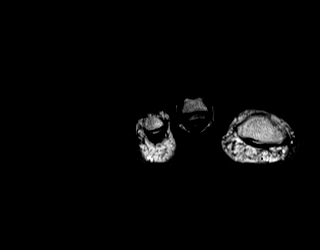
[im 16/47]
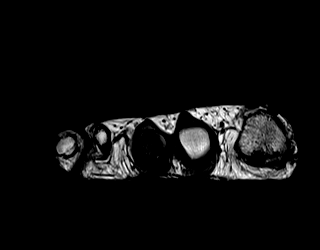
[im 21/47]
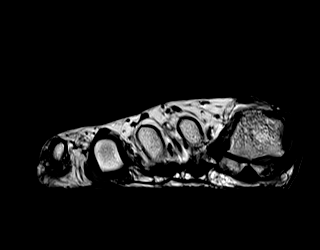
[im 26/47]
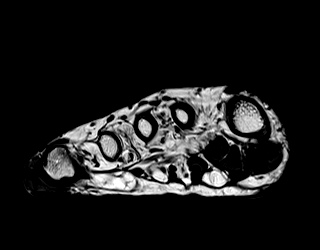
[im 31/47]
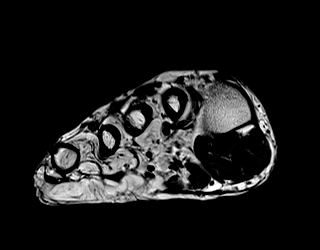
[im 41/47]
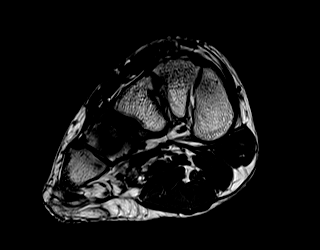
[im 47/47]
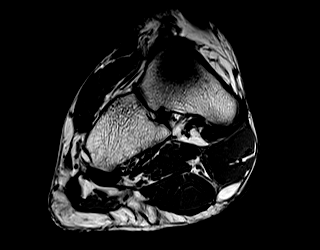

[Series 4: T2 fat-sat · coronal · right · 3.0mm · 0.31mm/px · 8 of 47 slices shown (1 of 2)]
[im 1/47]
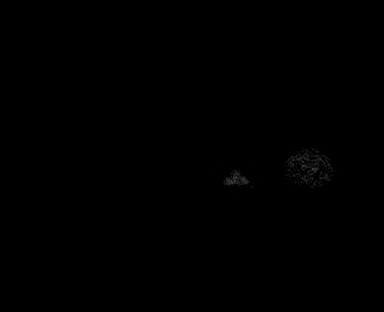
[im 6/47]
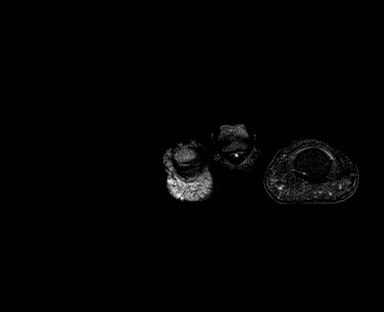
[im 16/47]
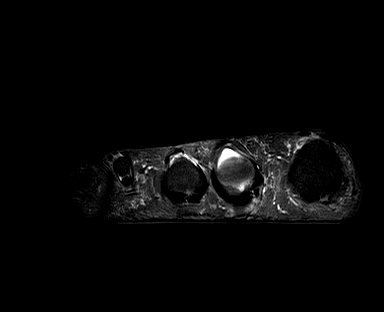
[im 21/47]
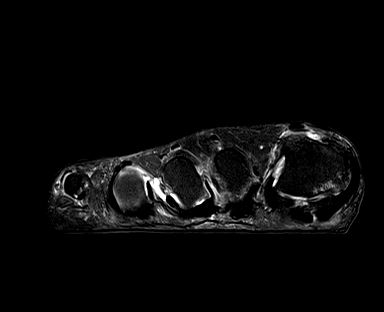
[im 26/47]
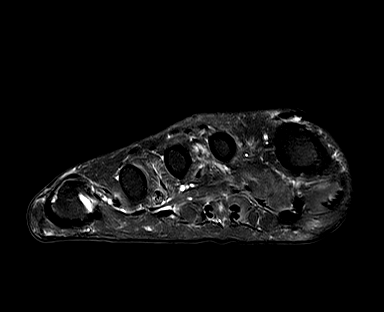
[im 31/47]
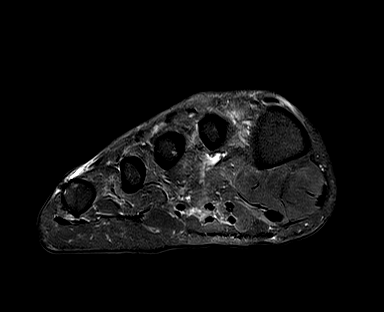
[im 41/47]
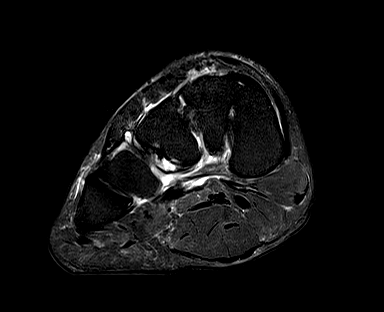
[im 47/47]
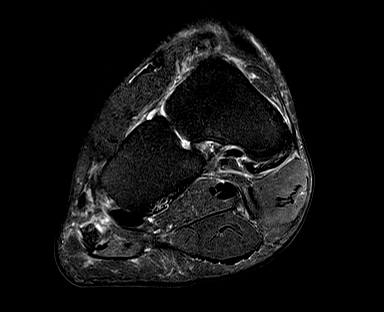

[Series 5: T1 · axial · right · 3.0mm · 0.56mm/px · z∈[-75,-13]mm · 4 of 18 slices shown (2 of 2)]
[im 1/18]
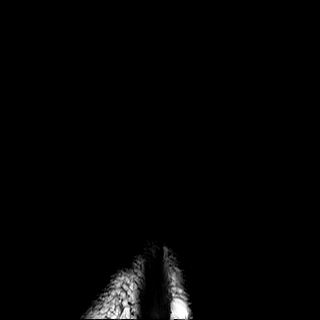
[im 6/18]
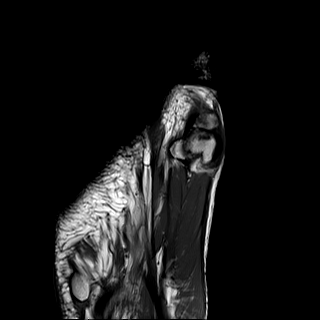
[im 12/18]
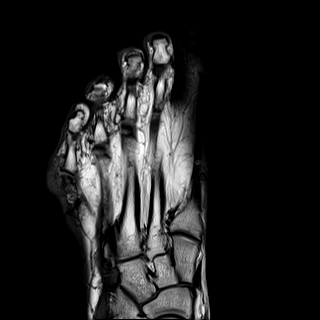
[im 18/18]
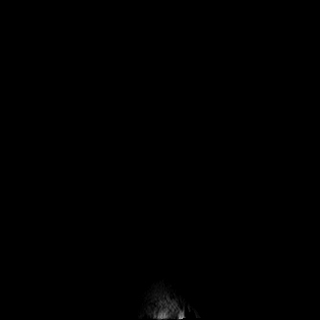

[Series 6: T2 fat-sat · axial · right · 3.0mm · 0.47mm/px · z∈[-75,-13]mm · 4 of 18 slices shown (2 of 2)]
[im 1/18]
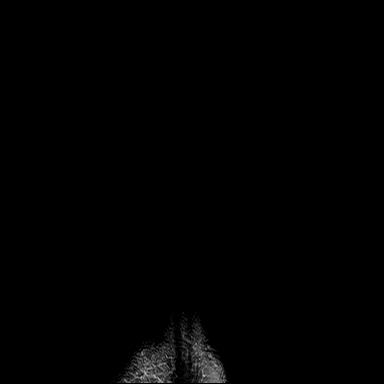
[im 6/18]
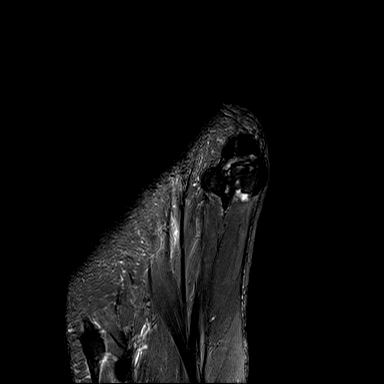
[im 12/18]
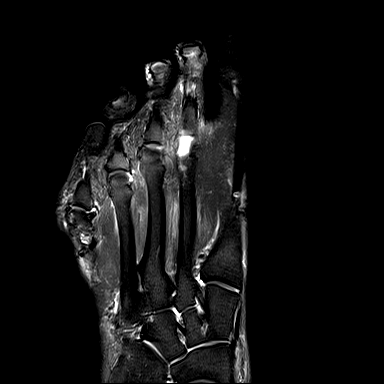
[im 18/18]
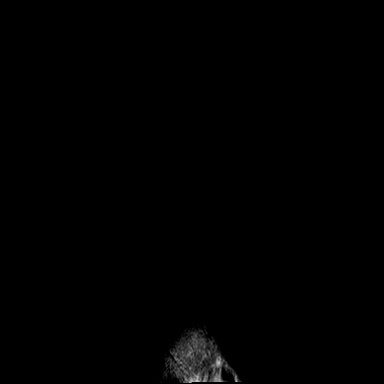

[Series 7: STIR · sagittal · right · 3.0mm · 0.70mm/px · 6 of 30 slices shown]
[im 1/30]
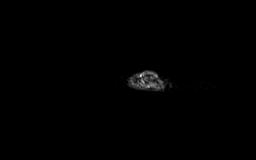
[im 6/30]
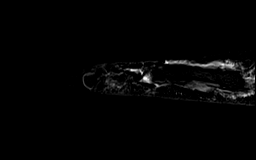
[im 12/30]
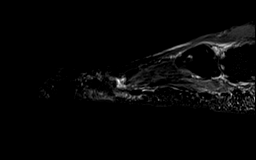
[im 18/30]
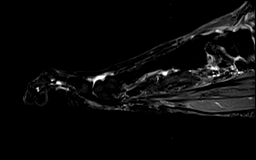
[im 24/30]
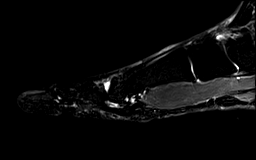
[im 30/30]
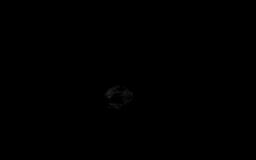

[Series 8: PD fat-sat · sagittal · right · 3.0mm · 0.25mm/px · 6 of 30 slices shown]
[im 1/30]
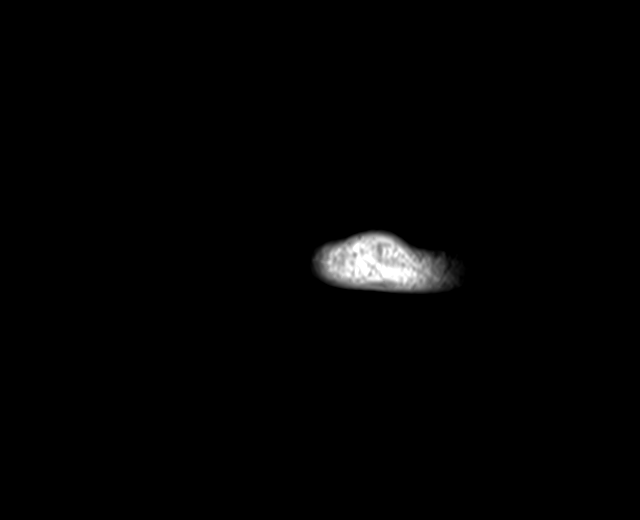
[im 6/30]
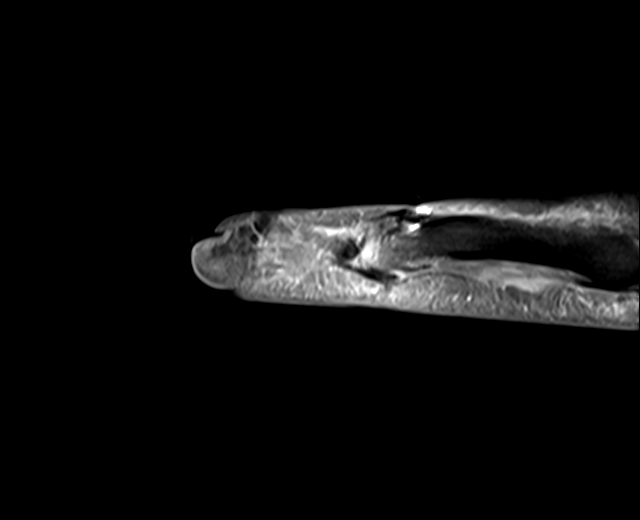
[im 12/30]
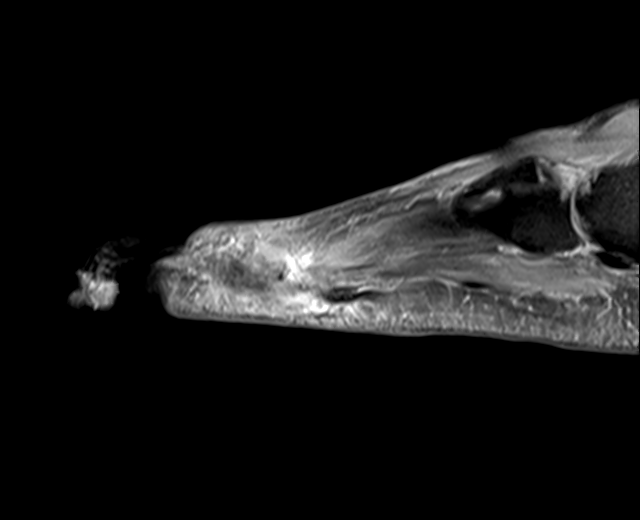
[im 18/30]
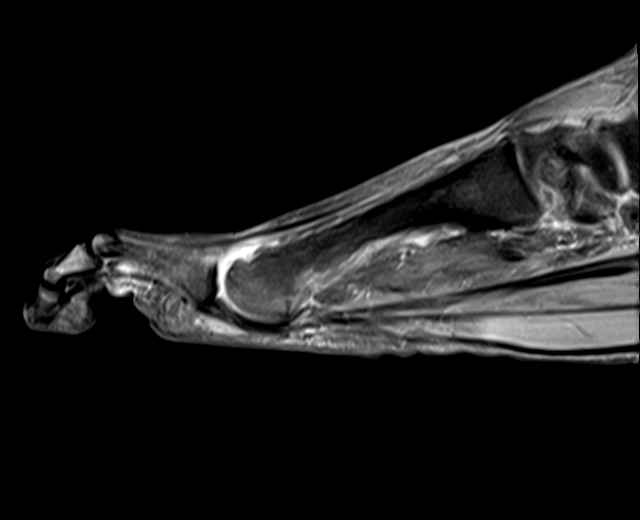
[im 24/30]
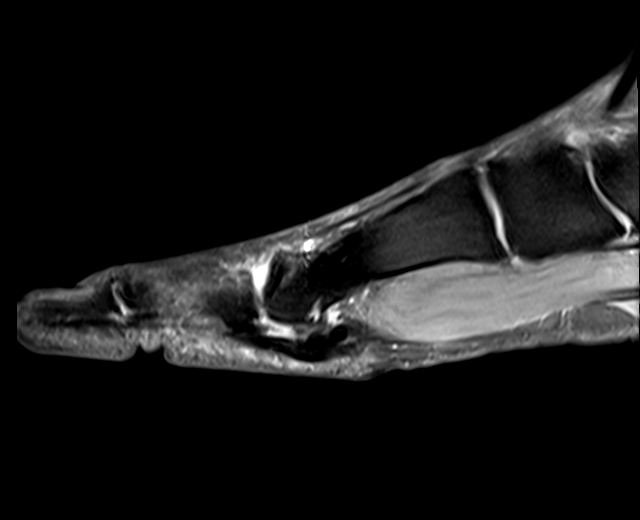
[im 30/30]
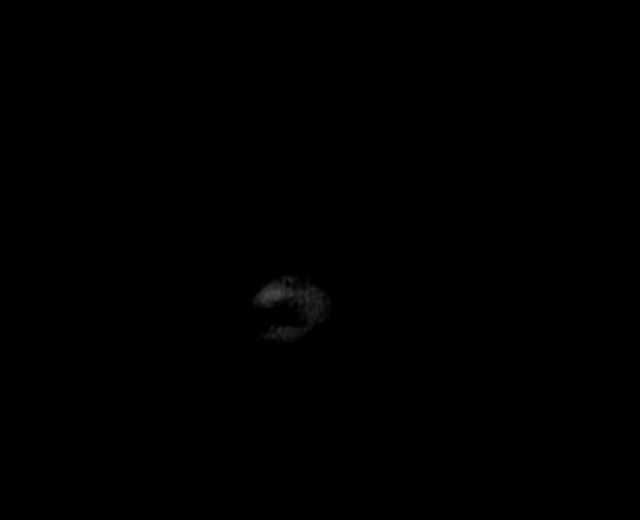

[36 of 40 positions shown; findings below may reference images not displayed]

FINDINGS: Bones/Joint/Cartilage

There is no evidence of acute fracture. The cortex is intact. There
is susceptibility artifact along the dorsal surface of the fifth
metatarsal head/neck suggesting the presence of a foreign body or
postsurgical change. Similar susceptibility along the dorsal surface
of the first metatarsal head/neck.

Ligaments

Intact Lisfranc ligament. Medial collateral ligament and plantar
plate tear at the fifth MTP joint. Intact first through fourth MTP
collateral ligaments and plantar plates.

Muscles and Tendons

There is severe muscle atrophy of the interosseous muscles and
lateral plantar forefoot muscles. Preserved flexor hallucis and
abductor hallucis muscles. No acute tendon tear.

Soft tissues

No focal fluid collection. No evidence of intermetatarsal neuroma or
bursitis.
IMPRESSION: Severe muscle atrophy of the interosseous muscles and lateral
plantar forefoot muscles, with preservation of the flexor and
abductor hallucis muscles, consistent with chronic denervation due
to plantar neuropathy. There is some overlap in the medial and
lateral plantar nerve distributions.

No evidence of acute fracture. Susceptibility artifact along the
surface of the fifth metatarsal head/neck suggesting the presence of
a foreign body and or postsurgical change. Similar susceptibility
artifact along the dorsal surface of the first metatarsal head/neck.

Evidence of a medial collateral ligament and plantar plate tear at
the fifth MTP joint.

## 2023-10-15 ENCOUNTER — Encounter: Payer: Self-pay | Admitting: Cardiology

## 2023-10-15 ENCOUNTER — Ambulatory Visit: Payer: BC Managed Care – PPO | Attending: Cardiology | Admitting: Cardiology

## 2023-10-15 VITALS — BP 148/90 | HR 59 | Ht 66.0 in | Wt 256.2 lb

## 2023-10-15 DIAGNOSIS — I10 Essential (primary) hypertension: Secondary | ICD-10-CM

## 2023-10-15 MED ORDER — AMLODIPINE BESYLATE 10 MG PO TABS: 10.0000 mg | ORAL_TABLET | Freq: Every day | ORAL | 0 refills | Status: DC

## 2023-10-15 NOTE — Progress Notes (Signed)
Cardiology Office Note:    Date:  10/15/2023   ID:  Sandra Brandt, DOB 1966-04-02, MRN [id number]  PCP:  Patient, No Pcp Per  Cardiologist:  Debbe Odea, MD  Electrophysiologist:  None   Referring MD: No ref. provider found   Chief Complaint  Patient presents with   Follow-up    Patient denies new or acute cardiac problems/concerns today.  BP elevated on office check today but has not taken morning bp meds yet.    History of Present Illness:    Sandra Brandt is a 58 y.o. female with a hx of GERD, hypertension, hyperlipidemia who presents for follow-up.  Patient feels well, denies chest pain or shortness of breath, has not taken BP meds this morning.  States cutting back on salt.  Endorses increased calorie intake over the holidays.  Last BP check at home was over a month ago.  Systolic around 137.   Prior notes Echo 10/2019 normal systolic and diastolic function, EF 65 to 70%. Coronary CTA 11/2019, calcium score of 0, no evidence of CAD.  Past Medical History:  Diagnosis Date   GERD (gastroesophageal reflux disease)    Hypertension    Plantar fasciitis of right foot 08/18/2020   Seasonal allergies    Trichomoniasis 08/2020    Past Surgical History:  Procedure Laterality Date   BUNIONECTOMY     FOOT SURGERY      Current Medications: Current Meds  Medication Sig   losartan (COZAAR) 100 MG tablet Take 1 tablet (100 mg total) by mouth daily.   Misc Natural Products (AIRBORNE ELDERBERRY) CHEW Chew by mouth.   [DISCONTINUED] amLODipine (NORVASC) 5 MG tablet Take 1 tablet (5 mg total) by mouth daily.     Allergies:   Elemental sulfur   Social History   Socioeconomic History   Marital status: Single    Spouse name: Not on file   Number of children: Not on file   Years of education: Not on file   Highest education level: Not on file  Occupational History   Not on file  Tobacco Use   Smoking status: Former    Current packs/day: 0.00    Types: Cigarettes     Quit date: 12/05/2018    Years since quitting: 4.8   Smokeless tobacco: Never   Tobacco comments:    social smoker   Vaping Use   Vaping status: Never Used  Substance and Sexual Activity   Alcohol use: Yes    Alcohol/week: 1.0 standard drink of alcohol    Types: 1 Glasses of wine per week    Comment: occasionally   Drug use: Never   Sexual activity: Not Currently  Other Topics Concern   Not on file  Social History Narrative   Not on file   Social Drivers of Health   Financial Resource Strain: Not on file  Food Insecurity: Not on file  Transportation Needs: Not on file  Physical Activity: Not on file  Stress: Not on file  Social Connections: Not on file     Family History: The patient's family history includes Cancer in her mother; Diabetes in her brother; Heart disease (age of onset: 46) in her mother; Hyperlipidemia in her mother and sister; Hypertension in her brother, mother, and sister.  ROS:   Please see the history of present illness.     All other systems reviewed and are negative.  EKGs/Labs/Other Studies Reviewed:    The following studies were reviewed today:   EKG:  EKG  not ordered today.   Recent Labs: No results found for requested labs within last 365 days.  Recent Lipid Panel    Component Value Date/Time   CHOL 189 07/07/2020 1123   TRIG 106 07/07/2020 1123   HDL 56 07/07/2020 1123   LDLCALC 114 (H) 07/07/2020 1123    Physical Exam:    VS:  BP (!) 148/90 (BP Location: Left Arm, Patient Position: Sitting, Cuff Size: Large)   Pulse (!) 59   Ht 5\' 6"  (1.676 m)   Wt 256 lb 3.2 oz (116.2 kg)   SpO2 99%   BMI 41.35 kg/m     Wt Readings from Last 3 Encounters:  10/15/23 256 lb 3.2 oz (116.2 kg)  07/15/23 250 lb (113.4 kg)  06/08/23 250 lb (113.4 kg)     GEN:  Well nourished, well developed in no acute distress HEENT: Normal NECK: No JVD; No carotid bruits CARDIAC: RRR RESPIRATORY:  Clear to auscultation without rales, wheezing or  rhonchi  ABDOMEN: Soft, non-tender, non-distended MUSCULOSKELETAL:  No edema; right foot slightly tender to palpation SKIN: Warm and dry NEUROLOGIC:  Alert and oriented x 3 PSYCHIATRIC:  Normal affect   ASSESSMENT:    1. Primary hypertension   2. Morbid obesity (HCC)    PLAN:    In order of problems listed above:  Hypertension, BP elevated.  Increase Norvasc to 10 mg daily, continue losartan 100 mg daily. Obesity.  Low-calorie diet, weight loss advised.    Follow-up in 3 months.   Medication Adjustments/Labs and Tests Ordered: Current medicines are reviewed at length with the patient today.  Concerns regarding medicines are outlined above.  Orders Placed This Encounter  Procedures   EKG 12-Lead   Meds ordered this encounter  Medications   amLODipine (NORVASC) 10 MG tablet    Sig: Take 1 tablet (10 mg total) by mouth daily.    Dispense:  90 tablet    Refill:  0    Patient Instructions  Medication Instructions:   INCREASE Amlodipine - Take one tablet ( 10mg ) by mouth daily.   *If you need a refill on your cardiac medications before your next appointment, please call your pharmacy*   Lab Work:  None Ordered  If you have labs (blood work) drawn today and your tests are completely normal, you will receive your results only by: MyChart Message (if you have MyChart) OR A paper copy in the mail If you have any lab test that is abnormal or we need to change your treatment, we will call you to review the results.   Testing/Procedures:  None Ordered    Follow-Up: At [facility], you and your health needs are our priority.  As part of our continuing mission to provide you with exceptional heart care, we have created designated Provider Care Teams.  These Care Teams include your primary Cardiologist (physician) and Advanced Practice Providers (APPs -  Physician Assistants and Nurse Practitioners) who all work together to provide you with the care you need,  when you need it.  We recommend signing up for the patient portal called "MyChart".  Sign up information is provided on this After Visit Summary.  MyChart is used to connect with patients for Virtual Visits (Telemedicine).  Patients are able to view lab/test results, encounter notes, upcoming appointments, etc.  Non-urgent messages can be sent to your provider as well.   To learn more about what you can do with MyChart, go to [email or web address].    Your next appointment:  3 month(s)  Provider:   You may see Debbe Odea, MD or one of the following Advanced Practice Providers on your designated Care Team:   Nicolasa Ducking, NP Eula Listen, PA-C Cadence Fransico Michael, PA-C Charlsie Quest, NP Carlos Levering, NP '   Signed, Debbe Odea, MD  10/15/2023 9:50 AM    [facility]

## 2023-10-15 NOTE — Patient Instructions (Signed)
Medication Instructions:   INCREASE Amlodipine - Take one tablet ( 10mg ) by mouth daily.   *If you need a refill on your cardiac medications before your next appointment, please call your pharmacy*   Lab Work:  None Ordered  If you have labs (blood work) drawn today and your tests are completely normal, you will receive your results only by: MyChart Message (if you have MyChart) OR A paper copy in the mail If you have any lab test that is abnormal or we need to change your treatment, we will call you to review the results.   Testing/Procedures:  None Ordered    Follow-Up: At [facility], you and your health needs are our priority.  As part of our continuing mission to provide you with exceptional heart care, we have created designated Provider Care Teams.  These Care Teams include your primary Cardiologist (physician) and Advanced Practice Providers (APPs -  Physician Assistants and Nurse Practitioners) who all work together to provide you with the care you need, when you need it.  We recommend signing up for the patient portal called "MyChart".  Sign up information is provided on this After Visit Summary.  MyChart is used to connect with patients for Virtual Visits (Telemedicine).  Patients are able to view lab/test results, encounter notes, upcoming appointments, etc.  Non-urgent messages can be sent to your provider as well.   To learn more about what you can do with MyChart, go to [email or web address].    Your next appointment:   3 month(s)  Provider:   You may see Debbe Odea, MD or one of the following Advanced Practice Providers on your designated Care Team:   Nicolasa Ducking, NP Eula Listen, PA-C Cadence Fransico Michael, PA-C Charlsie Quest, NP Carlos Levering, NP '

## 2023-10-23 ENCOUNTER — Telehealth: Payer: Self-pay | Admitting: Cardiology

## 2023-10-23 NOTE — Telephone Encounter (Signed)
 Called patient, she states she started taking the 5 mg at the same time (she has not yet picked up the 10 mg tablet from the pharmacy) but she doubled up on what she had. She states when she did that she became very nauseous. When she changed to taking it 5 mg in the morning and 5 mg in the evening this made her feel better and her blood pressure is down as well. She would like to continue taking it this way. Advised I would notify MD to make sure he is okay with this update and we can change medication list.   Patient verbalized understanding, thankful for call back.

## 2023-10-23 NOTE — Telephone Encounter (Signed)
 Pt c/o medication issue:  1. Name of Medication:   amLODipine  (NORVASC ) 10 MG tablet   2. How are you currently taking this medication (dosage and times per day)?     3. Are you having a reaction (difficulty breathing--STAT)?   4. What is your medication issue?    Patient stated she does better with this medication by taking 5 mg in the morning and 5 mg at dinner time versus taking 10 mg once daily.  Patient wants call back to confirm this dosage change.

## 2023-10-24 ENCOUNTER — Other Ambulatory Visit: Payer: Self-pay

## 2023-10-24 MED ORDER — AMLODIPINE BESYLATE 5 MG PO TABS: 5.0000 mg | ORAL_TABLET | Freq: Two times a day (BID) | ORAL | 1 refills | Status: DC

## 2023-10-24 NOTE — Telephone Encounter (Signed)
 RX updated.  Thanks!

## 2024-01-13 ENCOUNTER — Ambulatory Visit: Payer: BC Managed Care – PPO | Attending: Cardiology | Admitting: Cardiology

## 2024-01-13 ENCOUNTER — Encounter: Payer: Self-pay | Admitting: Cardiology

## 2024-01-13 VITALS — BP 152/82 | HR 67 | Ht 66.0 in | Wt 255.2 lb

## 2024-01-13 DIAGNOSIS — I10 Essential (primary) hypertension: Secondary | ICD-10-CM | POA: Diagnosis not present

## 2024-01-13 NOTE — Progress Notes (Signed)
 Cardiology Office Note:    Date:  01/13/2024   ID:  Sandra Brandt, DOB 27-Jan-1966, MRN [id number]  PCP:  Patient, No Pcp Per  Cardiologist:  Constancia Delton, MD  Electrophysiologist:  None   Referring MD: No ref. provider found   Chief Complaint  Patient presents with   Follow-up    3 month follow up visit. Patient is doing ok on today. Meds reviewed.     History of Present Illness:    Sandra Brandt is a 57 y.o. female with a hx of GERD, hypertension, hyperlipidemia who presents for follow-up.  Being seen for hypertension, endorsed eating salty foods such as chips, cake.  She works third shift which is impeding her desire to exercise.  Otherwise doing okay, compliant with BP medications as prescribed.  BP check at home systolic is in the 130s.  Has not taken BP meds this morning.   Prior notes Echo 10/2019 normal systolic and diastolic function, EF 65 to 70%. Coronary CTA 11/2019, calcium score of 0, no evidence of CAD.  Past Medical History:  Diagnosis Date   GERD (gastroesophageal reflux disease)    Hypertension    Plantar fasciitis of right foot 08/18/2020   Seasonal allergies    Trichomoniasis 08/2020    Past Surgical History:  Procedure Laterality Date   BUNIONECTOMY     FOOT SURGERY      Current Medications: Current Meds  Medication Sig   amLODipine  (NORVASC ) 5 MG tablet Take 1 tablet (5 mg total) by mouth in the morning and at bedtime.   Loratadine (CLARITIN PO) Take 1 tablet by mouth as needed.   losartan  (COZAAR ) 100 MG tablet Take 1 tablet (100 mg total) by mouth daily.   Misc Natural Products (AIRBORNE ELDERBERRY) CHEW Chew by mouth.   olopatadine  (PATADAY ) 0.1 % ophthalmic solution Place 1 drop into the left eye 2 (two) times daily. (Patient taking differently: Place 1 drop into the left eye 2 (two) times daily as needed for allergies.)     Allergies:   Elemental sulfur   Social History   Socioeconomic History   Marital status: Single    Spouse  name: Not on file   Number of children: Not on file   Years of education: Not on file   Highest education level: Not on file  Occupational History   Not on file  Tobacco Use   Smoking status: Former    Current packs/day: 0.00    Types: Cigarettes    Quit date: 12/05/2018    Years since quitting: 5.1   Smokeless tobacco: Never   Tobacco comments:    social smoker   Vaping Use   Vaping status: Never Used  Substance and Sexual Activity   Alcohol use: Yes    Alcohol/week: 1.0 standard drink of alcohol    Types: 1 Glasses of wine per week    Comment: occasionally   Drug use: Never   Sexual activity: Not Currently  Other Topics Concern   Not on file  Social History Narrative   Not on file   Social Drivers of Health   Financial Resource Strain: Not on file  Food Insecurity: Not on file  Transportation Needs: Not on file  Physical Activity: Not on file  Stress: Not on file  Social Connections: Not on file     Family History: The patient's family history includes Cancer in her mother; Diabetes in her brother; Heart disease (age of onset: 1) in her mother; Hyperlipidemia in her  mother and sister; Hypertension in her brother, mother, and sister.  ROS:   Please see the history of present illness.     All other systems reviewed and are negative.  EKGs/Labs/Other Studies Reviewed:    The following studies were reviewed today:   EKG:  EKG not ordered today.   Recent Labs: No results found for requested labs within last 365 days.  Recent Lipid Panel    Component Value Date/Time   CHOL 189 07/07/2020 1123   TRIG 106 07/07/2020 1123   HDL 56 07/07/2020 1123   LDLCALC 114 (H) 07/07/2020 1123    Physical Exam:    VS:  BP (!) 152/82   Pulse 67   Ht 5\' 6"  (1.676 m)   Wt 255 lb 3.2 oz (115.8 kg)   SpO2 100%   BMI 41.19 kg/m     Wt Readings from Last 3 Encounters:  01/13/24 255 lb 3.2 oz (115.8 kg)  10/15/23 256 lb 3.2 oz (116.2 kg)  07/15/23 250 lb (113.4 kg)      GEN:  Well nourished, well developed in no acute distress HEENT: Normal NECK: No JVD; No carotid bruits CARDIAC: RRR RESPIRATORY:  Clear to auscultation without rales, wheezing or rhonchi  ABDOMEN: Soft, non-tender, non-distended MUSCULOSKELETAL:  No edema; right foot slightly tender to palpation SKIN: Warm and dry NEUROLOGIC:  Alert and oriented x 3 PSYCHIATRIC:  Normal affect   ASSESSMENT:    1. Primary hypertension   2. Morbid obesity (HCC)    PLAN:    In order of problems listed above:  Hypertension, BP elevated, better controlled at home with systolic in the 130s.  Continue Norvasc  10 mg daily, losartan  100 mg daily.  Low-salt diet emphasized.  Consider HCTZ if BP not adequately controlled. Obesity.  Low-calorie diet, weight loss advised.    Follow-up in 6 months.   Medication Adjustments/Labs and Tests Ordered: Current medicines are reviewed at length with the patient today.  Concerns regarding medicines are outlined above.  No orders of the defined types were placed in this encounter.  No orders of the defined types were placed in this encounter.   Patient Instructions  Medication Instructions:  Your Physician recommend you continue on your current medication as directed.     *If you need a refill on your cardiac medications before your next appointment, please call your pharmacy*  Lab Work: No labs ordered today   Testing/Procedures: No test ordered today   Follow-Up: At [facility], you and your health needs are our priority.  As part of our continuing mission to provide you with exceptional heart care, our providers are all part of one team.  This team includes your primary Cardiologist (physician) and Advanced Practice Providers or APPs (Physician Assistants and Nurse Practitioners) who all work together to provide you with the care you need, when you need it.  Your next appointment:   6 month(s)  Provider:   You may see Constancia Delton,  MD or one of the following Advanced Practice Providers on your designated Care Team:   Laneta Pintos, NP Gildardo Labrador, PA-C Varney Gentleman, PA-C Cadence Havelock, PA-C Ronald Cockayne, NP Morey Ar, NP    We recommend signing up for the patient portal called "MyChart".  Sign up information is provided on this After Visit Summary.  MyChart is used to connect with patients for Virtual Visits (Telemedicine).  Patients are able to view lab/test results, encounter notes, upcoming appointments, etc.  Non-urgent messages can be sent to  your provider as well.   To learn more about what you can do with MyChart, go to [email or web address].         Signed, Constancia Delton, MD  01/13/2024 12:41 PM    [facility]

## 2024-01-13 NOTE — Patient Instructions (Signed)
 Medication Instructions:  Your Physician recommend you continue on your current medication as directed.     *If you need a refill on your cardiac medications before your next appointment, please call your pharmacy*  Lab Work: No labs ordered today   Testing/Procedures: No test ordered today   Follow-Up: At [facility], you and your health needs are our priority.  As part of our continuing mission to provide you with exceptional heart care, our providers are all part of one team.  This team includes your primary Cardiologist (physician) and Advanced Practice Providers or APPs (Physician Assistants and Nurse Practitioners) who all work together to provide you with the care you need, when you need it.  Your next appointment:   6 month(s)  Provider:   You may see Constancia Delton, MD or one of the following Advanced Practice Providers on your designated Care Team:   Laneta Pintos, NP Gildardo Labrador, PA-C Varney Gentleman, PA-C Cadence [place], PA-C Ronald Cockayne, NP Morey Ar, NP    We recommend signing up for the patient portal called "MyChart".  Sign up information is provided on this After Visit Summary.  MyChart is used to connect with patients for Virtual Visits (Telemedicine).  Patients are able to view lab/test results, encounter notes, upcoming appointments, etc.  Non-urgent messages can be sent to your provider as well.   To learn more about what you can do with MyChart, go to [email or web address].

## 2024-02-27 ENCOUNTER — Emergency Department
Admission: EM | Admit: 2024-02-27 | Discharge: 2024-02-27 | Disposition: A | Discharge status: Home / Self Care | Attending: Emergency Medicine | Admitting: Emergency Medicine

## 2024-02-27 ENCOUNTER — Other Ambulatory Visit: Payer: Self-pay

## 2024-02-27 DIAGNOSIS — J029 Acute pharyngitis, unspecified: Secondary | ICD-10-CM | POA: Diagnosis not present

## 2024-02-27 DIAGNOSIS — H9201 Otalgia, right ear: Secondary | ICD-10-CM

## 2024-02-27 DIAGNOSIS — I10 Essential (primary) hypertension: Secondary | ICD-10-CM | POA: Diagnosis not present

## 2024-02-27 DIAGNOSIS — H6991 Unspecified Eustachian tube disorder, right ear: Secondary | ICD-10-CM | POA: Diagnosis not present

## 2024-02-27 DIAGNOSIS — H6981 Other specified disorders of Eustachian tube, right ear: Secondary | ICD-10-CM | POA: Diagnosis not present

## 2024-02-27 NOTE — ED Triage Notes (Signed)
 Pt here with a right earache x2 days. Pt also c/o bilateral shoulder and body aches, pt states she has been cleaning dorms recently. Pt ambulatory to triage.

## 2024-02-27 NOTE — ED Provider Notes (Signed)
 [facility] Provider Note   Event Date/Time   First MD Initiated Contact with Patient 02/27/24 1138     (approximate) History  Ear Pain  HPI Sandra Brandt is a 58 y.o. female with a stated past medical history of seasonal allergies, hypertension, and trichomoniasis who presents complaining of of right earache for the last 2 days after hearing a popping and fluid sensation in her ear.  Patient states that she has a history of severe allergies for which she takes medication daily however she has not been using her Pataday  eyedrops recently.  Patient states that 2 days ago she felt a pop in this right ear and is now feeling her heartbeat in it as well as a bubbling sensation and sound whenever she is yawning.  Patient endorses associated sore throat ROS: Patient currently denies any vision changes, tinnitus, difficulty speaking, facial droop, chest pain, shortness of breath, abdominal pain, nausea/vomiting/diarrhea, dysuria, or weakness/numbness/paresthesias in any extremity   Physical Exam  Triage Vital Signs: ED Triage Vitals  Encounter Vitals Group     BP 02/27/24 1133 (!) 159/86     Girls Systolic BP Percentile --      Girls Diastolic BP Percentile --      Boys Systolic BP Percentile --      Boys Diastolic BP Percentile --      Pulse Rate 02/27/24 1133 73     Resp 02/27/24 1133 17     Temp 02/27/24 1133 98.3 F (36.8 C)     Temp Source 02/27/24 1133 Oral     SpO2 02/27/24 1133 97 %     Weight 02/27/24 1132 255 lb 4.7 oz (115.8 kg)     Height 02/27/24 1132 5' 6 (1.676 m)     Head Circumference --      Peak Flow --      Pain Score 02/27/24 1132 7     Pain Loc --      Pain Education --      Exclude from Growth Chart --    Most recent vital signs: Vitals:   02/27/24 1133  BP: (!) 159/86  Pulse: 73  Resp: 17  Temp: 98.3 F (36.8 C)  SpO2: 97%   General: Awake, oriented x4. CV:  Good peripheral perfusion. Resp:  Normal effort. Abd:  No  distention. Other:  Middle-aged obese African-American female resting comfortably in no acute distress.  TMs pearly and all landmarks appreciated bilaterally.  Erythematous posterior oropharynx ED Results / Procedures / Treatments  Labs (all labs ordered are listed, but only abnormal results are displayed) Labs Reviewed - No data to display PROCEDURES: Critical Care performed: No Procedures MEDICATIONS ORDERED IN ED: Medications - No data to display IMPRESSION / MDM / ASSESSMENT AND PLAN / ED COURSE  I reviewed the triage vital signs and the nursing notes.                             The patient is on the cardiac monitor to evaluate for evidence of arrhythmia and/or significant heart rate changes. Patient's presentation is most consistent with acute presentation with potential threat to life or bodily function. Exam and history most consistent with eustachian tube dysfunction. I have a low suspicion at this time for otitis media, mastoiditis, malignant otitis externa, herpes, retained foreign body.  Rx: Anti-inflammatories and decongestants Patient also can encouraged to continue using her Pataday  eyedrops and oral antihistamines Disposition: Discharge.  If symptoms worsen or persist for 48-72 then pt to fill the prescription. Cautious return precautions discussed w/ full understanding.   FINAL CLINICAL IMPRESSION(S) / ED DIAGNOSES   Final diagnoses:  Right ear pain  Eustachian tube dysfunction, right   Rx / DC Orders   ED Discharge Orders     None      Note:  This document was prepared using Dragon voice recognition software and may include unintentional dictation errors.   Jebadiah Imperato K, MD 02/27/24 1255

## 2024-02-27 NOTE — Discharge Instructions (Addendum)
 Please use ibuprofen  (Motrin ) up to 800 mg every 8 hours, naproxen  (Naprosyn ) up to 500 mg every 12 hours, and/or acetaminophen  (Tylenol ) up to 4 g/day for any continued right ear pain.  Please do not use this medication regimen for longer than 7 days Please use nasal decongestants like pseudoephedrine or phenylephrine for the next 3 days to assist with decongestion

## 2024-03-06 ENCOUNTER — Telehealth: Payer: Self-pay | Admitting: Cardiology

## 2024-03-06 MED ORDER — LOSARTAN POTASSIUM 100 MG PO TABS: 100.0000 mg | ORAL_TABLET | Freq: Every day | ORAL | 3 refills | Status: DC

## 2024-03-06 NOTE — Telephone Encounter (Signed)
 Pt's medication was sent to pt's pharmacy as requested. Confirmation received.

## 2024-03-06 NOTE — Telephone Encounter (Signed)
*  STAT* If patient is at the pharmacy, call can be transferred to refill team.   1. Which medications need to be refilled? (please list name of each medication and dose if known)   losartan  (COZAAR ) 100 MG tablet   2. Would you like to learn more about the convenience, safety, & potential cost savings by using the [facility]?   3. Are you open to using the [facility] (Type [facility]. ).  4. Which pharmacy/location (including street and city if local pharmacy) is medication to be sent to?  [id number] - [place], [place] - [address]   5. Do they need a 30 day or 90 day supply?   90 day  Patient stated she is completely out of this medication.

## 2024-03-23 DIAGNOSIS — M7541 Impingement syndrome of right shoulder: Secondary | ICD-10-CM | POA: Diagnosis not present

## 2024-03-23 DIAGNOSIS — M7542 Impingement syndrome of left shoulder: Secondary | ICD-10-CM | POA: Diagnosis not present

## 2024-04-10 DIAGNOSIS — M25512 Pain in left shoulder: Secondary | ICD-10-CM | POA: Diagnosis not present

## 2024-04-10 DIAGNOSIS — G8929 Other chronic pain: Secondary | ICD-10-CM | POA: Diagnosis not present

## 2024-07-15 ENCOUNTER — Ambulatory Visit: Attending: Cardiology | Admitting: Cardiology

## 2024-07-15 ENCOUNTER — Encounter: Payer: Self-pay | Admitting: Cardiology

## 2024-07-15 VITALS — BP 160/82 | HR 62 | Ht 66.0 in | Wt 251.4 lb

## 2024-07-15 DIAGNOSIS — I10 Essential (primary) hypertension: Secondary | ICD-10-CM

## 2024-07-15 MED ORDER — HYDROCHLOROTHIAZIDE 25 MG PO TABS: 25.0000 mg | ORAL_TABLET | Freq: Every day | ORAL | 3 refills | Status: AC

## 2024-07-15 NOTE — Patient Instructions (Signed)
 Medication Instructions:  - STOP losartan  - START hydrochlorothiazide 25 mg daily  *If you need a refill on your cardiac medications before your next appointment, please call your pharmacy*  Lab Work: No labs ordered today  If you have labs (blood work) drawn today and your tests are completely normal, you will receive your results only by: MyChart Message (if you have MyChart) OR A paper copy in the mail If you have any lab test that is abnormal or we need to change your treatment, we will call you to review the results.  Testing/Procedures: No test ordered today   Follow-Up: At [facility], you and your health needs are our priority.  As part of our continuing mission to provide you with exceptional heart care, our providers are all part of one team.  This team includes your primary Cardiologist (physician) and Advanced Practice Providers or APPs (Physician Assistants and Nurse Practitioners) who all work together to provide you with the care you need, when you need it.  Your next appointment:   6 week(s)  Provider:   Redell Cave, MD    We recommend signing up for the patient portal called MyChart.  Sign up information is provided on this After Visit Summary.  MyChart is used to connect with patients for Virtual Visits (Telemedicine).  Patients are able to view lab/test results, encounter notes, upcoming appointments, etc.  Non-urgent messages can be sent to your provider as well.   To learn more about what you can do with MyChart, go to [email or web address].

## 2024-07-15 NOTE — Progress Notes (Signed)
 Cardiology Office Note:    Date:  07/15/2024   ID:  Sandra Brandt, DOB 06/28/1966, MRN [id number]  PCP:  Patient, No Pcp Per  Cardiologist:  Redell Cave, MD  Electrophysiologist:  None   Referring MD: No ref. provider found   Chief Complaint  Patient presents with   Follow-up    6 month follow up pt has been doing well with no complaints of chest pain, chest pressure or SOB, medciation reviewed verbally with patient    History of Present Illness:    Sandra Brandt is a 58 y.o. female with a hx of GERD, hypertension, hyperlipidemia who presents for follow-up.  States doing okay, has noticed some ringing in the ears with taking losartan .  Ran out of losartan  for 2 days, symptoms seem to resolve when she was off losartan .  Compliant with medications as prescribed.  Doing okay no new concerns.   Prior notes Echo 10/2019 normal systolic and diastolic function, EF 65 to 70%. Coronary CTA 11/2019, calcium score of 0, no evidence of CAD.  Past Medical History:  Diagnosis Date   GERD (gastroesophageal reflux disease)    Hypertension    Plantar fasciitis of right foot 08/18/2020   Seasonal allergies    Trichomoniasis 08/2020    Past Surgical History:  Procedure Laterality Date   BUNIONECTOMY     FOOT SURGERY      Current Medications: Current Meds  Medication Sig   amLODipine  (NORVASC ) 5 MG tablet Take 1 tablet (5 mg total) by mouth in the morning and at bedtime.   hydrochlorothiazide (HYDRODIURIL) 25 MG tablet Take 1 tablet (25 mg total) by mouth daily.   Loratadine (CLARITIN PO) Take 1 tablet by mouth as needed.   Misc Natural Products (AIRBORNE ELDERBERRY) CHEW Chew by mouth.   mometasone  (NASONEX ) 50 MCG/ACT nasal spray Place 2 sprays into the nose daily.   olopatadine  (PATADAY ) 0.1 % ophthalmic solution Place 1 drop into the left eye 2 (two) times daily.   [DISCONTINUED] losartan  (COZAAR ) 100 MG tablet Take 1 tablet (100 mg total) by mouth daily.     Allergies:    Elemental sulfur   Social History   Socioeconomic History   Marital status: Single    Spouse name: Not on file   Number of children: Not on file   Years of education: Not on file   Highest education level: Not on file  Occupational History   Not on file  Tobacco Use   Smoking status: Former    Current packs/day: 0.00    Types: Cigarettes    Quit date: 12/05/2018    Years since quitting: 5.6   Smokeless tobacco: Never   Tobacco comments:    social smoker   Vaping Use   Vaping status: Never Used  Substance and Sexual Activity   Alcohol use: Yes    Alcohol/week: 1.0 standard drink of alcohol    Types: 1 Glasses of wine per week    Comment: occasionally   Drug use: Never   Sexual activity: Not Currently  Other Topics Concern   Not on file  Social History Narrative   Not on file   Social Drivers of Health   Financial Resource Strain: Not on file  Food Insecurity: Not on file  Transportation Needs: Not on file  Physical Activity: Not on file  Stress: Not on file  Social Connections: Not on file     Family History: The patient's family history includes Cancer in her mother; Diabetes in  her brother; Heart disease (age of onset: 1) in her mother; Hyperlipidemia in her mother and sister; Hypertension in her brother, mother, and sister.  ROS:   Please see the history of present illness.     All other systems reviewed and are negative.  EKGs/Labs/Other Studies Reviewed:    The following studies were reviewed today:  EKG Interpretation Date/Time:  Wednesday July 15 2024 08:56:28 EDT Ventricular Rate:  62 PR Interval:  174 QRS Duration:  86 QT Interval:  426 QTC Calculation: 432 R Axis:   26  Text Interpretation: Normal sinus rhythm Nonspecific T wave abnormality Confirmed by Darliss Rogue ([id number]) on 07/15/2024 9:05:16 AM    Recent Labs: No results found for requested labs within last 365 days.  Recent Lipid Panel    Component Value Date/Time   CHOL  189 07/07/2020 1123   TRIG 106 07/07/2020 1123   HDL 56 07/07/2020 1123   LDLCALC 114 (H) 07/07/2020 1123    Physical Exam:    VS:  BP (!) 160/82 (BP Location: Right Arm, Patient Position: Sitting) Comment: did not take her meds this morning and has some left arm pain  Pulse 62   Ht 5' 6 (1.676 m)   Wt 251 lb 6.4 oz (114 kg)   SpO2 99%   BMI 40.58 kg/m     Wt Readings from Last 3 Encounters:  07/15/24 251 lb 6.4 oz (114 kg)  02/27/24 255 lb 4.7 oz (115.8 kg)  01/13/24 255 lb 3.2 oz (115.8 kg)     GEN:  Well nourished, well developed in no acute distress HEENT: Normal NECK: No JVD; No carotid bruits CARDIAC: RRR RESPIRATORY:  Clear to auscultation without rales, wheezing or rhonchi  ABDOMEN: Soft, non-tender, non-distended MUSCULOSKELETAL:  No edema; right foot slightly tender to palpation SKIN: Warm and dry NEUROLOGIC:  Alert and oriented x 3 PSYCHIATRIC:  Normal affect   ASSESSMENT:    1. Primary hypertension   2. Morbid obesity (HCC)    PLAN:    In order of problems listed above:  Hypertension, BP elevated, does not seem to tolerate losartan .  Start HCTZ 25 mg daily, continue Norvasc  10 mg daily (to 5mg  tabs).  Consolidate Norvasc  at follow-up visit if no adverse effects noted. Obesity.  Low-calorie diet, weight loss advised.    Follow-up in 6 weeks.   Medication Adjustments/Labs and Tests Ordered: Current medicines are reviewed at length with the patient today.  Concerns regarding medicines are outlined above.  Orders Placed This Encounter  Procedures   EKG 12-Lead   Meds ordered this encounter  Medications   hydrochlorothiazide (HYDRODIURIL) 25 MG tablet    Sig: Take 1 tablet (25 mg total) by mouth daily.    Dispense:  90 tablet    Refill:  3    Patient Instructions  Medication Instructions:  - STOP losartan  - START hydrochlorothiazide 25 mg daily  *If you need a refill on your cardiac medications before your next appointment, please call your  pharmacy*  Lab Work: No labs ordered today  If you have labs (blood work) drawn today and your tests are completely normal, you will receive your results only by: MyChart Message (if you have MyChart) OR A paper copy in the mail If you have any lab test that is abnormal or we need to change your treatment, we will call you to review the results.  Testing/Procedures: No test ordered today   Follow-Up: At [facility], you and your health needs are our  priority.  As part of our continuing mission to provide you with exceptional heart care, our providers are all part of one team.  This team includes your primary Cardiologist (physician) and Advanced Practice Providers or APPs (Physician Assistants and Nurse Practitioners) who all work together to provide you with the care you need, when you need it.  Your next appointment:   6 week(s)  Provider:   Redell Cave, MD    We recommend signing up for the patient portal called MyChart.  Sign up information is provided on this After Visit Summary.  MyChart is used to connect with patients for Virtual Visits (Telemedicine).  Patients are able to view lab/test results, encounter notes, upcoming appointments, etc.  Non-urgent messages can be sent to your provider as well.   To learn more about what you can do with MyChart, go to [email or web address].          Signed, Redell Cave, MD  07/15/2024 10:28 AM    [facility]

## 2024-08-27 ENCOUNTER — Ambulatory Visit: Attending: Cardiology | Admitting: Cardiology

## 2024-08-27 ENCOUNTER — Encounter: Payer: Self-pay | Admitting: Cardiology

## 2024-08-27 VITALS — BP 140/72 | HR 69 | Ht 66.0 in | Wt 253.6 lb

## 2024-08-27 DIAGNOSIS — I10 Essential (primary) hypertension: Secondary | ICD-10-CM | POA: Diagnosis not present

## 2024-08-27 MED ORDER — AMLODIPINE BESYLATE 10 MG PO TABS: 10.0000 mg | ORAL_TABLET | Freq: Two times a day (BID) | ORAL | 3 refills | Status: AC

## 2024-08-27 MED ORDER — LOSARTAN POTASSIUM 100 MG PO TABS: 100.0000 mg | ORAL_TABLET | Freq: Every day | ORAL | 3 refills | Status: AC

## 2024-08-27 NOTE — Progress Notes (Signed)
 Cardiology Office Note:    Date:  08/27/2024   ID:  Sandra Brandt, DOB 30-Dec-1965, MRN [id number]  PCP:  Patient, No Pcp Per  Cardiologist:  Redell Cave, MD  Electrophysiologist:  None   Referring MD: No ref. provider found   Chief Complaint  Patient presents with   Follow-up    6 weeks follow up  / pt has been doing well with no complaints of chest pain, chest pressure or SOB, medication reviewed verbally with patient .    History of Present Illness:    Sandra Brandt is a 58 y.o. female with a hx of GERD, hypertension, hyperlipidemia who presents for follow-up.  Previously seen due to hypertension, started on HCTZ but patient did not tolerate due to dizziness, weakness.  She stopped taking HCTZ with improvement in symptoms.  Blood pressures at home have been ranging in the 130s to 140s systolic.  Overall doing okay, has cut back on salt and sodas.   Prior notes Echo 10/2019 normal systolic and diastolic function, EF 65 to 70%. Coronary CTA 11/2019, calcium score of 0, no evidence of CAD.  Past Medical History:  Diagnosis Date   GERD (gastroesophageal reflux disease)    Hypertension    Plantar fasciitis of right foot 08/18/2020   Seasonal allergies    Trichomoniasis 08/2020    Past Surgical History:  Procedure Laterality Date   BUNIONECTOMY     FOOT SURGERY      Current Medications: Current Meds  Medication Sig   Loratadine (CLARITIN PO) Take 1 tablet by mouth as needed.   losartan  (COZAAR ) 100 MG tablet Take 1 tablet (100 mg total) by mouth daily.   Misc Natural Products (AIRBORNE ELDERBERRY) CHEW Chew by mouth.   mometasone  (NASONEX ) 50 MCG/ACT nasal spray Place 2 sprays into the nose daily.   olopatadine  (PATADAY ) 0.1 % ophthalmic solution Place 1 drop into the left eye 2 (two) times daily. (Patient taking differently: Place 1 drop into the left eye daily as needed.)   [DISCONTINUED] amLODipine  (NORVASC ) 5 MG tablet Take 1 tablet (5 mg total) by mouth in  the morning and at bedtime.     Allergies:   Elemental sulfur   Social History   Socioeconomic History   Marital status: Single    Spouse name: Not on file   Number of children: Not on file   Years of education: Not on file   Highest education level: Not on file  Occupational History   Not on file  Tobacco Use   Smoking status: Former    Current packs/day: 0.00    Average packs/day: 6.0 packs/day    Types: Cigarettes    Quit date: 12/05/2018    Years since quitting: 5.7   Smokeless tobacco: Never   Tobacco comments:    social smoker   Vaping Use   Vaping status: Never Used  Substance and Sexual Activity   Alcohol use: Yes    Alcohol/week: 1.0 standard drink of alcohol    Types: 1 Glasses of wine per week    Comment: occasionally   Drug use: Never   Sexual activity: Not Currently  Other Topics Concern   Not on file  Social History Narrative   Not on file   Social Drivers of Health   Tobacco Use: Medium Risk (08/27/2024)   Patient History    Smoking Tobacco Use: Former    Smokeless Tobacco Use: Never    Passive Exposure: Not on file  Financial Resource Strain: Not  on file  Food Insecurity: Not on file  Transportation Needs: Not on file  Physical Activity: Not on file  Stress: Not on file  Social Connections: Not on file  Depression (EYV7-0): Not on file  Alcohol Screen: Not on file  Housing: Not on file  Utilities: Not on file  Health Literacy: Not on file     Family History: The patient's family history includes Cancer in her mother; Diabetes in her brother; Heart disease (age of onset: 45) in her mother; Hyperlipidemia in her mother and sister; Hypertension in her brother, mother, and sister.  ROS:   Please see the history of present illness.     All other systems reviewed and are negative.  EKGs/Labs/Other Studies Reviewed:    The following studies were reviewed today:       Recent Labs: No results found for requested labs within last 365  days.  Recent Lipid Panel    Component Value Date/Time   CHOL 189 07/07/2020 1123   TRIG 106 07/07/2020 1123   HDL 56 07/07/2020 1123   LDLCALC 114 (H) 07/07/2020 1123    Physical Exam:    VS:  BP (!) 140/72 (BP Location: Left Arm, Patient Position: Sitting, Cuff Size: Normal)   Pulse 69   Ht 5' 6 (1.676 m)   Wt 253 lb 9.6 oz (115 kg)   SpO2 97%   BMI 40.93 kg/m     Wt Readings from Last 3 Encounters:  08/27/24 253 lb 9.6 oz (115 kg)  07/15/24 251 lb 6.4 oz (114 kg)  02/27/24 255 lb 4.7 oz (115.8 kg)     GEN:  Well nourished, well developed in no acute distress HEENT: Normal NECK: No JVD; No carotid bruits CARDIAC: RRR RESPIRATORY:  Clear to auscultation without rales, wheezing or rhonchi  ABDOMEN: Soft, non-tender, non-distended MUSCULOSKELETAL:  No edema; right foot slightly tender to palpation SKIN: Warm and dry NEUROLOGIC:  Alert and oriented x 3 PSYCHIATRIC:  Normal affect   ASSESSMENT:    1. Primary hypertension   2. Morbid obesity (HCC)     PLAN:    In order of problems listed above:  Hypertension, BP elevated, stop HCTZ due to adverse effect, restart losartan  100 mg daily, continue Norvasc  10 mg daily.   Obesity.  Low-calorie diet, weight loss advised.    Follow-up in 6 to 8 months   Medication Adjustments/Labs and Tests Ordered: Current medicines are reviewed at length with the patient today.  Concerns regarding medicines are outlined above.  No orders of the defined types were placed in this encounter.  Meds ordered this encounter  Medications   losartan  (COZAAR ) 100 MG tablet    Sig: Take 1 tablet (100 mg total) by mouth daily.    Dispense:  90 tablet    Refill:  3   amLODipine  (NORVASC ) 10 MG tablet    Sig: Take 1 tablet (10 mg total) by mouth in the morning and at bedtime.    Dispense:  90 tablet    Refill:  3    Patient Instructions  Medication Instructions:  - START losartan  100 mg daily - INCREASE norvasc  to 10 mg   *If you  need a refill on your cardiac medications before your next appointment, please call your pharmacy*  Lab Work: No labs ordered today  If you have labs (blood work) drawn today and your tests are completely normal, you will receive your results only by: MyChart Message (if you have MyChart) OR A paper copy  in the mail If you have any lab test that is abnormal or we need to change your treatment, we will call you to review the results.  Testing/Procedures: No test ordered today   Follow-Up: At [facility], you and your health needs are our priority.  As part of our continuing mission to provide you with exceptional heart care, our providers are all part of one team.  This team includes your primary Cardiologist (physician) and Advanced Practice Providers or APPs (Physician Assistants and Nurse Practitioners) who all work together to provide you with the care you need, when you need it.  Your next appointment:   8 month(s)  Provider:   You may see Redell Cave, MD or one of the following Advanced Practice Providers on your designated Care Team:   Lonni Meager, NP Lesley Maffucci, PA-C Bernardino Bring, PA-C Cadence [place], PA-C Tylene Lunch, NP Barnie Hila, NP    We recommend signing up for the patient portal called MyChart.  Sign up information is provided on this After Visit Summary.  MyChart is used to connect with patients for Virtual Visits (Telemedicine).  Patients are able to view lab/test results, encounter notes, upcoming appointments, etc.  Non-urgent messages can be sent to your provider as well.   To learn more about what you can do with MyChart, go to [email or web address].       Signed, Redell Cave, MD  08/27/2024 1:24 PM    [facility]

## 2024-08-27 NOTE — Patient Instructions (Signed)
 Medication Instructions:  - START losartan  100 mg daily - INCREASE norvasc  to 10 mg   *If you need a refill on your cardiac medications before your next appointment, please call your pharmacy*  Lab Work: No labs ordered today  If you have labs (blood work) drawn today and your tests are completely normal, you will receive your results only by: MyChart Message (if you have MyChart) OR A paper copy in the mail If you have any lab test that is abnormal or we need to change your treatment, we will call you to review the results.  Testing/Procedures: No test ordered today   Follow-Up: At [facility], you and your health needs are our priority.  As part of our continuing mission to provide you with exceptional heart care, our providers are all part of one team.  This team includes your primary Cardiologist (physician) and Advanced Practice Providers or APPs (Physician Assistants and Nurse Practitioners) who all work together to provide you with the care you need, when you need it.  Your next appointment:   8 month(s)  Provider:   You may see Redell Cave, MD or one of the following Advanced Practice Providers on your designated Care Team:   Lonni Meager, NP Lesley Maffucci, PA-C Bernardino Bring, PA-C Cadence Woodstock, PA-C Tylene Lunch, NP Barnie Hila, NP    We recommend signing up for the patient portal called MyChart.  Sign up information is provided on this After Visit Summary.  MyChart is used to connect with patients for Virtual Visits (Telemedicine).  Patients are able to view lab/test results, encounter notes, upcoming appointments, etc.  Non-urgent messages can be sent to your provider as well.   To learn more about what you can do with MyChart, go to [email or web address].
